# Patient Record
Sex: Male | Born: 2000 | Race: White | Hispanic: No | Marital: Single | Smoking: Never smoker
Health system: Southern US, Community
[De-identification: ages and names within clinical notes are randomized; demographics above are authoritative.]

## PROBLEM LIST (undated history)

## (undated) DIAGNOSIS — K219 Gastro-esophageal reflux disease without esophagitis: Secondary | ICD-10-CM

## (undated) DIAGNOSIS — G8929 Other chronic pain: Secondary | ICD-10-CM

## (undated) DIAGNOSIS — L309 Dermatitis, unspecified: Secondary | ICD-10-CM

## (undated) DIAGNOSIS — E119 Type 2 diabetes mellitus without complications: Secondary | ICD-10-CM

## (undated) DIAGNOSIS — T148XXA Other injury of unspecified body region, initial encounter: Secondary | ICD-10-CM

## (undated) DIAGNOSIS — I456 Pre-excitation syndrome: Secondary | ICD-10-CM

## (undated) HISTORY — DX: Gastro-esophageal reflux disease without esophagitis: K21.9

## (undated) HISTORY — DX: Other chronic pain: G89.29

## (undated) HISTORY — PX: TONSILLECTOMY: SUR1361

## (undated) HISTORY — DX: Type 2 diabetes mellitus without complications: E11.9

---
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
## 2018-07-09 ENCOUNTER — Emergency Department
Admission: EM | Admit: 2018-07-09 | Discharge: 2018-07-09 | Disposition: A | Discharge status: Home / Self Care | Payer: Medicaid Other | Attending: Emergency Medicine | Admitting: Emergency Medicine

## 2018-07-09 DIAGNOSIS — Z7722 Contact with and (suspected) exposure to environmental tobacco smoke (acute) (chronic): Secondary | ICD-10-CM | POA: Insufficient documentation

## 2018-07-09 DIAGNOSIS — R197 Diarrhea, unspecified: Secondary | ICD-10-CM | POA: Insufficient documentation

## 2018-07-09 DIAGNOSIS — K529 Noninfective gastroenteritis and colitis, unspecified: Secondary | ICD-10-CM

## 2018-07-09 LAB — URINALYSIS, COMPLETE (UACMP) WITH MICROSCOPIC
Bacteria, UA: NONE SEEN
Bilirubin Urine: NEGATIVE
Glucose, UA: NEGATIVE mg/dL
Hgb urine dipstick: NEGATIVE
Ketones, ur: NEGATIVE mg/dL
Leukocytes, UA: NEGATIVE
Nitrite: NEGATIVE
Protein, ur: NEGATIVE mg/dL
Specific Gravity, Urine: 1.024 (ref 1.005–1.030)
Squamous Epithelial / HPF: NONE SEEN (ref 0–5)
pH: 5 (ref 5.0–8.0)

## 2018-07-09 LAB — COMPREHENSIVE METABOLIC PANEL
ALK PHOS: 73 U/L (ref 38–126)
ALT: 142 U/L — ABNORMAL HIGH (ref 0–44)
AST: 104 U/L — ABNORMAL HIGH (ref 15–41)
Albumin: 4.6 g/dL (ref 3.5–5.0)
Anion gap: 9 (ref 5–15)
BUN: 14 mg/dL (ref 6–20)
CO2: 26 mmol/L (ref 22–32)
Calcium: 9.6 mg/dL (ref 8.9–10.3)
Chloride: 102 mmol/L (ref 98–111)
Creatinine, Ser: 0.72 mg/dL (ref 0.61–1.24)
GFR calc Af Amer: >60 mL/min (ref >60)
GFR calc non Af Amer: >60 mL/min (ref >60)
Glucose, Bld: 110 mg/dL — ABNORMAL HIGH (ref 70–99)
Potassium: 3.8 mmol/L (ref 3.5–5.1)
Sodium: 137 mmol/L (ref 135–145)
Total Bilirubin: 0.6 mg/dL (ref 0.3–1.2)
Total Protein: 7.6 g/dL (ref 6.5–8.1)

## 2018-07-09 LAB — CBC
HCT: 42.8 % (ref 39.0–52.0)
Hemoglobin: 14.8 g/dL (ref 13.0–17.0)
MCH: 28.6 pg (ref 26.0–34.0)
MCHC: 34.6 g/dL (ref 30.0–36.0)
MCV: 82.6 fL (ref 80.0–100.0)
Platelets: 294 10*3/uL (ref 150–400)
RBC: 5.18 MIL/uL (ref 4.22–5.81)
RDW: 12.4 % (ref 11.5–15.5)
WBC: 7 10*3/uL (ref 4.0–10.5)
nRBC: 0 % (ref 0.0–0.2)

## 2018-07-09 LAB — LIPASE, BLOOD: Lipase: 42 U/L (ref 11–51)

## 2018-07-09 MED ORDER — DICYCLOMINE HCL 10 MG/ML IM SOLN
20.0000 mg | Freq: Once | INTRAMUSCULAR | Status: AC
  Administered 2018-07-09: 20 mg via INTRAMUSCULAR
  Filled 2018-07-09: qty 2

## 2018-07-09 MED ORDER — SODIUM CHLORIDE 0.9% FLUSH: 3.0000 mL | Freq: Once | INTRAVENOUS | Status: DC

## 2018-07-09 NOTE — ED Triage Notes (Signed)
Patient c/o lower abdominal pain, N/V beginning Tuesday.

## 2018-07-09 NOTE — ED Provider Notes (Signed)
[facility] Emergency Department Provider Note  ____________________________________________   I have reviewed the triage vital signs and the nursing notes. Where available I have reviewed prior notes and, if possible and indicated, outside hospital notes.    HISTORY  Chief Complaint Abdominal Pain    HPI Aaron Parker is a 18 y.o. male  He reports a history of WPW diagnosed by EKG by [employer] but never reports having any kind of tachydysrhythmia that he knows of, otherwise healthy person states that he has several sick contacts in himself since Tuesday, has been having GI symptoms.  Started with vomiting on Tuesday, and then Wednesday he had multiple episodes of diarrhea, he states that he is actually doing better, the vomiting is resolved, he had a full meal this evening, and he still has diarrhea but not as much.  He is had 3 episodes today.  No bleeding.  No hematemesis.  No melena.  He denies any fever or chills.  He took some home Zofran which seemed to help.  The patient states that the reason he is here is that he had time to come in tonight and he was worried that he still having some cramping.  He has a diffuse lower abdominal discomfort which is a cramping pain that comes and goes and seems to be relieved mostly when he has a bowel movement.  Happens right before the bowel movement then he has a bowel movement that he is pain-free.  At this moment he is not complaining of pain.  He denies any prior surgery in his abdomen.  He has no recent travel or recent antibiotics.  He does work in [employer] and would like a work note He has no right lower quadrant pain no right upper quadrant pain, he has a crampy discomfort that is more in the center and left.  No testicular pain or swelling.  Past Medical History:  Diagnosis Date  . Eczema   . Muscle strain   . Wolff-Parkinson-White (WPW) syndrome     There are no active problems to display for this  patient.   Past Surgical History:  Procedure Laterality Date  . ADENOIDECTOMY  2007  . TONSILLECTOMY      Prior to Admission medications   Medication Sig Start Date End Date Taking? Authorizing Provider  amoxicillin-clavulanate (AUGMENTIN) 875-125 MG tablet Take 1 tablet by mouth 2 (two) times daily. 08/25/17   Fisher, Roselyn Bering, PA-C  dicyclomine (BENTYL) 10 MG capsule Take 1 capsule (10 mg total) by mouth 3 (three) times daily as needed for up to 14 days for spasms. 08/26/17 09/09/17  Willy Eddy, MD  ondansetron (ZOFRAN ODT) 4 MG disintegrating tablet Take 1 tablet (4 mg total) by mouth every 8 (eight) hours as needed for nausea or vomiting. 08/02/17   Merrily Brittle, MD  promethazine (PHENERGAN) 12.5 MG tablet Take 1 tablet (12.5 mg total) by mouth every 6 (six) hours as needed for nausea or vomiting. 08/26/17   Willy Eddy, MD    Allergies Codeine  No family history on file.  Social History Social History   Tobacco Use  . Smoking status: Passive Smoke Exposure - Never Smoker  . Smokeless tobacco: Never Used  Substance Use Topics  . Alcohol use: No  . Drug use: Not on file    Review of Systems Constitutional: No fever/chills Eyes: No visual changes. ENT: No sore throat. No stiff neck no neck pain Cardiovascular: Denies chest pain. Respiratory: Denies shortness of  breath. Gastrointestinal: See HPI  genitourinary: Negative for dysuria. Musculoskeletal: Negative lower extremity swelling Skin: Negative for rash. Neurological: Negative for severe headaches, focal weakness or numbness.   ____________________________________________   PHYSICAL EXAM:  VITAL SIGNS: ED Triage Vitals  Enc Vitals Group     BP 07/09/18 2008 138/75     Pulse Rate 07/09/18 2008 79     Resp 07/09/18 2008 18     Temp 07/09/18 2008 98.7 F (37.1 C)     Temp Source 07/09/18 2008 Oral     SpO2 07/09/18 2008 98 %     Weight 07/09/18 2008 230 lb (104.3 kg)     Height 07/09/18 2008 5'  10" (1.778 m)     Head Circumference --      Peak Flow --      Pain Score 07/09/18 2014 5     Pain Loc --      Pain Edu? --      Excl. in GC? --     Constitutional: Alert and oriented. Well appearing and in no acute distress. Eyes: Conjunctivae are normal Head: Atraumatic HEENT: No congestion/rhinnorhea. Mucous membranes are moist.  Oropharynx non-erythematous Neck:   Nontender with no meningismus, no masses, no stridor Cardiovascular: Normal rate, regular rhythm. Grossly normal heart sounds.  Good peripheral circulation. Respiratory: Normal respiratory effort.  No retractions. Lungs CTAB. Abdominal: Soft and nontender. No distention. No guarding no rebound Back:  There is no focal tenderness or step off.  there is no midline tenderness there are no lesions noted. there is no CVA tenderness Normal external male genitalia, circumcised, no lesions or testicular pain or swelling Musculoskeletal: No lower extremity tenderness, no upper extremity tenderness. No joint effusions, no DVT signs strong distal pulses no edema Neurologic:  Normal speech and language. No gross focal neurologic deficits are appreciated.  Skin:  Skin is warm, dry and intact. No rash noted. Psychiatric: Mood and affect are normal. Speech and behavior are normal.  ____________________________________________   LABS (all labs ordered are listed, but only abnormal results are displayed)  Labs Reviewed  COMPREHENSIVE METABOLIC PANEL - Abnormal; Notable for the following components:      Result Value   Glucose, Bld 110 (*)    AST 104 (*)    ALT 142 (*)    All other components within normal limits  URINALYSIS, COMPLETE (UACMP) WITH MICROSCOPIC - Abnormal; Notable for the following components:   Color, Urine YELLOW (*)    APPearance CLEAR (*)    All other components within normal limits  LIPASE, BLOOD  CBC    Pertinent labs  results that were available during my care of the patient were reviewed by me and  considered in my medical decision making (see chart for details). ____________________________________________  EKG  I personally interpreted any EKGs ordered by me or triage  ____________________________________________  RADIOLOGY  Pertinent labs & imaging results that were available during my care of the patient were reviewed by me and considered in my medical decision making (see chart for details). If possible, patient and/or family made aware of any abnormal findings.  No results found. ____________________________________________    PROCEDURES  Procedure(s) performed: None  Procedures  Critical Care performed: None  ____________________________________________   INITIAL IMPRESSION / ASSESSMENT AND PLAN / ED COURSE  Pertinent labs & imaging results that were available during my care of the patient were reviewed by me and considered in my medical decision making (see chart for details).  Well-appearing patient here with  lower abdominal cramping which is associated with diarrhea and vomiting, he is seems to be getting over a viral illness.  Liver function tests are very slightly elevated which can be consistent either with fatty liver which is a possibility given the patient's obesity, or his likely GI symptoms which again I feel are viral.  He has no focal abdominal pain in fact I cannot really reproduce any pain at this time.  I do not think that he has gallbladder disease or appendicitis and I do not feel that he would benefit from a CT scan.  Patient has been drinking very well "all day long" and has no evidence of dehydration and declines IV fluid.  We talked about CT scan but he and I both agree and shared decision making that it is not in his best interest.  I do not think he has an appendectomy or anything this can require surgery.  In addition, patient has no evidence of testicular mass or torsion.  Finally, patient is requesting something in case the cramping comes back.   I explained in the best thing to do for that would be a take fluids but he would like a medication for it, I have therefore agreed to give him a single dose of Bentyl here.  We will have him follow closely with this department if he feels worse and is very comfortable with this plan.    ____________________________________________   FINAL CLINICAL IMPRESSION(S) / ED DIAGNOSES  Final diagnoses:  None      This chart was dictated using voice recognition software.  Despite best efforts to proofread,  errors can occur which can change meaning.      Jeanmarie PlantMcShane, James A, MD 07/09/18 2158

## 2018-07-31 ENCOUNTER — Other Ambulatory Visit: Payer: Self-pay

## 2018-07-31 DIAGNOSIS — R109 Unspecified abdominal pain: Secondary | ICD-10-CM | POA: Insufficient documentation

## 2018-07-31 DIAGNOSIS — Z5321 Procedure and treatment not carried out due to patient leaving prior to being seen by health care provider: Secondary | ICD-10-CM | POA: Insufficient documentation

## 2018-07-31 LAB — COMPREHENSIVE METABOLIC PANEL
ALT: 117 U/L — ABNORMAL HIGH (ref 0–44)
AST: 77 U/L — ABNORMAL HIGH (ref 15–41)
Albumin: 4.7 g/dL (ref 3.5–5.0)
Alkaline Phosphatase: 73 U/L (ref 38–126)
Anion gap: 9 (ref 5–15)
BILIRUBIN TOTAL: 0.4 mg/dL (ref 0.3–1.2)
BUN: 16 mg/dL (ref 6–20)
CO2: 26 mmol/L (ref 22–32)
Calcium: 9.6 mg/dL (ref 8.9–10.3)
Chloride: 101 mmol/L (ref 98–111)
Creatinine, Ser: 0.65 mg/dL (ref 0.61–1.24)
GFR calc Af Amer: >60 mL/min (ref >60)
GFR calc non Af Amer: >60 mL/min (ref >60)
Glucose, Bld: 210 mg/dL — ABNORMAL HIGH (ref 70–99)
Potassium: 3.6 mmol/L (ref 3.5–5.1)
Sodium: 136 mmol/L (ref 135–145)
Total Protein: 8.1 g/dL (ref 6.5–8.1)

## 2018-07-31 LAB — URINALYSIS, COMPLETE (UACMP) WITH MICROSCOPIC
Bacteria, UA: NONE SEEN
Bilirubin Urine: NEGATIVE
Glucose, UA: >=500 mg/dL — AB
Hgb urine dipstick: NEGATIVE
Ketones, ur: 5 mg/dL — AB
Leukocytes,Ua: NEGATIVE
Nitrite: NEGATIVE
Protein, ur: NEGATIVE mg/dL
Specific Gravity, Urine: 1.022 (ref 1.005–1.030)
Squamous Epithelial / HPF: NONE SEEN (ref 0–5)
pH: 7 (ref 5.0–8.0)

## 2018-07-31 LAB — CBC
HCT: 42.8 % (ref 39.0–52.0)
Hemoglobin: 14.6 g/dL (ref 13.0–17.0)
MCH: 28.1 pg (ref 26.0–34.0)
MCHC: 34.1 g/dL (ref 30.0–36.0)
MCV: 82.5 fL (ref 80.0–100.0)
PLATELETS: 300 10*3/uL (ref 150–400)
RBC: 5.19 MIL/uL (ref 4.22–5.81)
RDW: 12.6 % (ref 11.5–15.5)
WBC: 7.9 10*3/uL (ref 4.0–10.5)
nRBC: 0 % (ref 0.0–0.2)

## 2018-07-31 LAB — LIPASE, BLOOD: Lipase: 44 U/L (ref 11–51)

## 2018-07-31 NOTE — ED Triage Notes (Signed)
Patient her for right lower quad pain with nausea and vomiting.  Reports was seen for same "couple" weeks ago and is no better.

## 2018-08-01 ENCOUNTER — Emergency Department
Admission: EM | Admit: 2018-08-01 | Discharge: 2018-08-01 | Disposition: A | Discharge status: Home / Self Care | Payer: Self-pay | Attending: Emergency Medicine | Admitting: Emergency Medicine

## 2018-08-01 LAB — HEMOGLOBIN A1C
Hgb A1c MFr Bld: 6.1 % — ABNORMAL HIGH (ref 4.8–5.6)
Mean Plasma Glucose: 128.37 mg/dL

## 2018-08-01 NOTE — ED Notes (Signed)
Called lab to make sure they have what they need to run new test ordered by MD; affirmative given

## 2018-08-01 NOTE — ED Notes (Signed)
Rounding in waiting room, pt is no longer present to be seen by provider

## 2018-08-02 ENCOUNTER — Telehealth: Payer: Self-pay | Admitting: Emergency Medicine

## 2018-08-02 NOTE — Telephone Encounter (Signed)
Called patient due to lwot to inquire about condition and follow up plans.  No answer and no voicemail  

## 2018-09-09 ENCOUNTER — Emergency Department: Payer: Self-pay

## 2018-09-09 ENCOUNTER — Emergency Department
Admission: EM | Admit: 2018-09-09 | Discharge: 2018-09-09 | Disposition: A | Discharge status: Home / Self Care | Payer: Self-pay | Attending: Emergency Medicine | Admitting: Emergency Medicine

## 2018-09-09 ENCOUNTER — Other Ambulatory Visit: Payer: Self-pay

## 2018-09-09 ENCOUNTER — Encounter: Payer: Self-pay | Admitting: Emergency Medicine

## 2018-09-09 DIAGNOSIS — Z7722 Contact with and (suspected) exposure to environmental tobacco smoke (acute) (chronic): Secondary | ICD-10-CM | POA: Insufficient documentation

## 2018-09-09 DIAGNOSIS — F329 Major depressive disorder, single episode, unspecified: Secondary | ICD-10-CM | POA: Insufficient documentation

## 2018-09-09 DIAGNOSIS — F32A Depression, unspecified: Secondary | ICD-10-CM

## 2018-09-09 LAB — COMPREHENSIVE METABOLIC PANEL
ALT: 129 U/L — ABNORMAL HIGH (ref 0–44)
AST: 54 U/L — ABNORMAL HIGH (ref 15–41)
Albumin: 4.9 g/dL (ref 3.5–5.0)
Alkaline Phosphatase: 76 U/L (ref 38–126)
Anion gap: 10 (ref 5–15)
BUN: 14 mg/dL (ref 6–20)
CO2: 26 mmol/L (ref 22–32)
Calcium: 9.9 mg/dL (ref 8.9–10.3)
Chloride: 102 mmol/L (ref 98–111)
Creatinine, Ser: 0.79 mg/dL (ref 0.61–1.24)
GFR calc Af Amer: >60 mL/min (ref >60)
GFR calc non Af Amer: >60 mL/min (ref >60)
Glucose, Bld: 105 mg/dL — ABNORMAL HIGH (ref 70–99)
Potassium: 3.5 mmol/L (ref 3.5–5.1)
Sodium: 138 mmol/L (ref 135–145)
Total Bilirubin: 0.8 mg/dL (ref 0.3–1.2)
Total Protein: 8.4 g/dL — ABNORMAL HIGH (ref 6.5–8.1)

## 2018-09-09 LAB — URINALYSIS, COMPLETE (UACMP) WITH MICROSCOPIC
Bacteria, UA: NONE SEEN
Bilirubin Urine: NEGATIVE
Glucose, UA: 50 mg/dL — AB
Hgb urine dipstick: NEGATIVE
Ketones, ur: NEGATIVE mg/dL
Leukocytes,Ua: NEGATIVE
Nitrite: NEGATIVE
Protein, ur: 30 mg/dL — AB
Specific Gravity, Urine: 1.033 — ABNORMAL HIGH (ref 1.005–1.030)
pH: 5 (ref 5.0–8.0)

## 2018-09-09 LAB — CBC
HCT: 45.2 % (ref 39.0–52.0)
Hemoglobin: 15.7 g/dL (ref 13.0–17.0)
MCH: 28.9 pg (ref 26.0–34.0)
MCHC: 34.7 g/dL (ref 30.0–36.0)
MCV: 83.1 fL (ref 80.0–100.0)
Platelets: 329 10*3/uL (ref 150–400)
RBC: 5.44 MIL/uL (ref 4.22–5.81)
RDW: 12.6 % (ref 11.5–15.5)
WBC: 8.1 10*3/uL (ref 4.0–10.5)
nRBC: 0 % (ref 0.0–0.2)

## 2018-09-09 LAB — TSH: TSH: 3.932 u[IU]/mL (ref 0.350–4.500)

## 2018-09-09 LAB — LIPASE, BLOOD: Lipase: 33 U/L (ref 11–51)

## 2018-09-09 MED ORDER — MIRTAZAPINE 7.5 MG PO TABS: 7.5000 mg | ORAL_TABLET | Freq: Every day | ORAL | 3 refills | Status: DC

## 2018-09-09 NOTE — ED Notes (Signed)
Pt also reports not being able to sleep for 8 days.

## 2018-09-09 NOTE — ED Triage Notes (Signed)
Patient presents to the ED with constipation and vomiting x 3 days.  Patient states he is only getting approx. 10-15 min of sleep at night.  Patient reports vomiting x 2 in the past 24 hours.  Patient states his last bowel movement was approx. 3-4 days ago.  Patient states, "my main concern is not sleeping, that sucks."  Patient states he does feel like his mind is racing.  Denies history of anxiety.  Patient states he has been staying with his mom in [place] but came to [place] today to help his ex-girlfriend with something.

## 2018-09-09 NOTE — Discharge Instructions (Addendum)
I talked to the psychiatrist.  She suggested taking mirtazapine 7.5 mg before bed.  She feels that this will help you sleep and also improve your appetite.  She wants you call and to go over to [facility] tomorrow.  They can continue with your treatment.  I would also advise getting out in the warm sun and taking a walk getting a little more exercise like you were used to.  Nothing really strenuous though.  Return here for any problems at all.  Be sure to follow-up with your primary care doctor to check on the hepatitis tests.  They should be back in the next 3 or 4 days.  You can follow-up with the [facility], the [place] [facility], the [facility] clinic, the [facility], [facility], [facility] charity care or the [facility] clinic.  These are sometimes for you may have to try several times.  You also have to fill out paperwork before hand.  If you have problems just come back here to the emergency room.

## 2018-09-09 NOTE — ED Provider Notes (Signed)
[facility] Emergency Department Provider Note   ____________________________________________   First MD Initiated Contact with Patient 09/09/18 1120     (approximate)  I have reviewed the triage vital signs and the nursing notes.   HISTORY  Chief Complaint Emesis; Constipation; and Insomnia    HPI Aaron Parker is a 18 y.o. male patient reports he slept very little in the last week or so.  He just has a lot of racing thoughts that he cannot stop when he tries to go to bed.  He vomited twice in the last 24 hours and has not stooled for 3 or 4 days.  Other than that he feels fine.        Past Medical History:  Diagnosis Date   Eczema    Muscle strain    Wolff-Parkinson-White (WPW) syndrome     There are no active problems to display for this patient.   Past Surgical History:  Procedure Laterality Date   ADENOIDECTOMY  2007   TONSILLECTOMY      Prior to Admission medications   Medication Sig Start Date End Date Taking? Authorizing Provider  amoxicillin-clavulanate (AUGMENTIN) 875-125 MG tablet Take 1 tablet by mouth 2 (two) times daily. 08/25/17   Fisher, Roselyn Bering, PA-C  dicyclomine (BENTYL) 10 MG capsule Take 1 capsule (10 mg total) by mouth 3 (three) times daily as needed for up to 14 days for spasms. 08/26/17 09/09/17  Willy Eddy, MD  mirtazapine (REMERON) 7.5 MG tablet Take 1 tablet (7.5 mg total) by mouth at bedtime. 09/09/18   Arnaldo Natal, MD  ondansetron (ZOFRAN ODT) 4 MG disintegrating tablet Take 1 tablet (4 mg total) by mouth every 8 (eight) hours as needed for nausea or vomiting. 08/02/17   Merrily Brittle, MD  promethazine (PHENERGAN) 12.5 MG tablet Take 1 tablet (12.5 mg total) by mouth every 6 (six) hours as needed for nausea or vomiting. 08/26/17   Willy Eddy, MD    Allergies Codeine  History reviewed. No pertinent family history.  Social History Social History   Tobacco Use   Smoking status: Passive  Smoke Exposure - Never Smoker   Smokeless tobacco: Never Used  Substance Use Topics   Alcohol use: No   Drug use: Not on file    Review of Systems  Constitutional: No fever/chills Eyes: No visual changes. ENT: No sore throat. Cardiovascular: Denies chest pain. Respiratory: Denies shortness of breath. Gastrointestinal: No abdominal pain.  No nausea, no vomiting except as noted in HPI.  No diarrhea.   constipation. Genitourinary: Negative for dysuria. Musculoskeletal: Negative for back pain. Skin: Negative for rash. Neurological: Negative for headaches, focal weakness  ____________________________________________   PHYSICAL EXAM:  VITAL SIGNS: ED Triage Vitals  Enc Vitals Group     BP 09/09/18 1107 139/84     Pulse Rate 09/09/18 1107 78     Resp 09/09/18 1107 20     Temp 09/09/18 1107 97.8 F (36.6 C)     Temp Source 09/09/18 1107 Oral     SpO2 09/09/18 1107 98 %     Weight 09/09/18 1111 225 lb (102.1 kg)     Height 09/09/18 1111 5\' 10"  (1.778 m)     Head Circumference --      Peak Flow --      Pain Score 09/09/18 1111 5     Pain Loc --      Pain Edu? --      Excl. in GC? --  Constitutional: Alert and oriented. Well appearing and in no acute distress. Eyes: Conjunctivae are normal.  Head: Atraumatic. Nose: No congestion/rhinnorhea. Mouth/Throat: Mucous membranes are moist.  Oropharynx non-erythematous. Neck: No stridor.  Cardiovascular: Normal rate, regular rhythm. Grossly normal heart sounds.  Good peripheral circulation. Respiratory: Normal respiratory effort.  No retractions. Lungs CTAB. Gastrointestinal: Soft and nontender. No distention. No abdominal bruits. No CVA tenderness. Musculoskeletal: No lower extremity tenderness nor edema.   Neurologic:  Normal speech and language. No gross focal neurologic deficits are appreciated.  Skin:  Skin is warm, dry and intact. No rash noted. Psychiatric: Mood and affect are normal. Speech and behavior are normal.   Patient does in no way appear to be manic  ____________________________________________   LABS (all labs ordered are listed, but only abnormal results are displayed)  Labs Reviewed  COMPREHENSIVE METABOLIC PANEL - Abnormal; Notable for the following components:      Result Value   Glucose, Bld 105 (*)    Total Protein 8.4 (*)    AST 54 (*)    ALT 129 (*)    All other components within normal limits  URINALYSIS, COMPLETE (UACMP) WITH MICROSCOPIC - Abnormal; Notable for the following components:   Color, Urine YELLOW (*)    APPearance HAZY (*)    Specific Gravity, Urine 1.033 (*)    Glucose, UA 50 (*)    Protein, ur 30 (*)    All other components within normal limits  LIPASE, BLOOD  CBC  TSH  HEPATITIS PANEL, ACUTE   ____________________________________________  EKG   ____________________________________________  RADIOLOGY  ED MD interpretation: Abdominal films look essentially normal.  Does not eat a lot of stool in the colon.  Official radiology report(s): Dg Abdomen Acute W/chest  Result Date: 09/09/2018 CLINICAL DATA:  18 y/o male patient presents to the ED with constipation and vomiting x 3 days. Patient states he is only getting approx. 10-15 min of sleep at night. Patient reports vomiting x 2 in the past 24 hours. Patient states his last bowel movement was approx. 3-4 days ago. Hx of WPW. Passive smoke exposure. EXAM: DG ABDOMEN ACUTE W/ 1V CHEST COMPARISON:  None. FINDINGS: There is no evidence of dilated bowel loops or free intraperitoneal air. No radiopaque calculi or other significant radiographic abnormality is seen. Heart size and mediastinal contours are within normal limits. Both lungs are clear. IMPRESSION: Negative abdominal radiographs.  No acute cardiopulmonary disease. Electronically Signed   By: Amie Portland M.D.   On: 09/09/2018 11:45    ____________________________________________   PROCEDURES  Procedure(s) performed (including Critical  Care):  Procedures   ____________________________________________   INITIAL IMPRESSION / ASSESSMENT AND PLAN / ED COURSE  I spoke with the patient in more detail.  He reports he is laying in bed thinking about things all night long nothing in particular just he can get to sleep.  He is not eating as much as usual for the last few weeks.  He is getting thinner and losing weight.  His pants are getting loose on him.  He shows me the pants he is wearing now he says they were tight and now he is got about 2 inches of his room despair.  He says he used to exercise a lot but when he got the WPW he developed symptoms and so he stopped exercising.  He says he is a little bit depressed.  He is not suicidal or homicidal.  I discussed him with Dr. Rebecca Eaton psychiatry.  She recommend mirtazapine  7.5 nightly.  She feels that this will help him sleep and increase his appetite.  His acute abdominal series does not show a lot of stool present.  Is probably because he is not eating much.  I recommended he also get outside in the sun and walk around a little bit.  He does not have to do anything really strenuous it would trigger his WPW.  He will come back here if he is any worse.  He will try to follow-up with primary care to to annual evaluation of his elevated LFTs.  Hepatitis panel still pending.  Not expecting it back for couple days yet.  She can follow-up with [facility].  He has the contact information I discussed it with him.              ____________________________________________   FINAL CLINICAL IMPRESSION(S) / ED DIAGNOSES  Final diagnoses:  Depression, unspecified depression type     ED Discharge Orders         Ordered    mirtazapine (REMERON) 7.5 MG tablet  Daily at bedtime     09/09/18 1355           Note:  This document was prepared using Dragon voice recognition software and may include unintentional dictation errors.    Arnaldo Natal, MD 09/09/18 1414

## 2018-09-10 LAB — HEPATITIS PANEL, ACUTE
HCV Ab: <0.1 s/co ratio (ref 0.0–0.9)
Hep A IgM: NEGATIVE
Hep B C IgM: NEGATIVE
Hepatitis B Surface Ag: NEGATIVE

## 2018-09-21 ENCOUNTER — Other Ambulatory Visit: Payer: Self-pay

## 2018-09-21 DIAGNOSIS — Z7722 Contact with and (suspected) exposure to environmental tobacco smoke (acute) (chronic): Secondary | ICD-10-CM | POA: Insufficient documentation

## 2018-09-21 DIAGNOSIS — G4700 Insomnia, unspecified: Secondary | ICD-10-CM | POA: Insufficient documentation

## 2018-09-21 DIAGNOSIS — F329 Major depressive disorder, single episode, unspecified: Secondary | ICD-10-CM | POA: Insufficient documentation

## 2018-09-21 DIAGNOSIS — Z79899 Other long term (current) drug therapy: Secondary | ICD-10-CM | POA: Insufficient documentation

## 2018-09-21 DIAGNOSIS — R945 Abnormal results of liver function studies: Secondary | ICD-10-CM | POA: Insufficient documentation

## 2018-09-21 DIAGNOSIS — I456 Pre-excitation syndrome: Secondary | ICD-10-CM | POA: Insufficient documentation

## 2018-09-21 LAB — URINALYSIS, COMPLETE (UACMP) WITH MICROSCOPIC
Bacteria, UA: NONE SEEN
Bilirubin Urine: NEGATIVE
Glucose, UA: NEGATIVE mg/dL
Hgb urine dipstick: NEGATIVE
Ketones, ur: 5 mg/dL — AB
Leukocytes,Ua: NEGATIVE
Nitrite: NEGATIVE
Protein, ur: 30 mg/dL — AB
Specific Gravity, Urine: 1.027 (ref 1.005–1.030)
pH: 6 (ref 5.0–8.0)

## 2018-09-21 LAB — CBC
HCT: 47.2 % (ref 39.0–52.0)
Hemoglobin: 16 g/dL (ref 13.0–17.0)
MCH: 28.5 pg (ref 26.0–34.0)
MCHC: 33.9 g/dL (ref 30.0–36.0)
MCV: 84.1 fL (ref 80.0–100.0)
Platelets: 327 10*3/uL (ref 150–400)
RBC: 5.61 MIL/uL (ref 4.22–5.81)
RDW: 12.5 % (ref 11.5–15.5)
WBC: 6.9 10*3/uL (ref 4.0–10.5)
nRBC: 0 % (ref 0.0–0.2)

## 2018-09-21 LAB — COMPREHENSIVE METABOLIC PANEL
ALT: 145 U/L — ABNORMAL HIGH (ref 0–44)
AST: 115 U/L — ABNORMAL HIGH (ref 15–41)
Albumin: 5.2 g/dL — ABNORMAL HIGH (ref 3.5–5.0)
Alkaline Phosphatase: 76 U/L (ref 38–126)
Anion gap: 14 (ref 5–15)
BUN: 13 mg/dL (ref 6–20)
CO2: 26 mmol/L (ref 22–32)
Calcium: 9.8 mg/dL (ref 8.9–10.3)
Chloride: 100 mmol/L (ref 98–111)
Creatinine, Ser: 0.67 mg/dL (ref 0.61–1.24)
GFR calc Af Amer: >60 mL/min (ref >60)
GFR calc non Af Amer: >60 mL/min (ref >60)
Glucose, Bld: 117 mg/dL — ABNORMAL HIGH (ref 70–99)
Potassium: 3.6 mmol/L (ref 3.5–5.1)
Sodium: 140 mmol/L (ref 135–145)
Total Bilirubin: 1.7 mg/dL — ABNORMAL HIGH (ref 0.3–1.2)
Total Protein: 8.7 g/dL — ABNORMAL HIGH (ref 6.5–8.1)

## 2018-09-21 LAB — LIPASE, BLOOD: Lipase: 33 U/L (ref 11–51)

## 2018-09-21 NOTE — ED Triage Notes (Signed)
Pt in with co insomnia, decreased appetite, and mid lower abd cramping. Was seen here for the same 2 weeks ago and given remeron. Pt states here for persistent symptoms, and increased abd cramping. Pt denies any SI or HI, when asked if he feels depressed he states "I guess".

## 2018-09-22 ENCOUNTER — Emergency Department: Payer: Self-pay

## 2018-09-22 ENCOUNTER — Emergency Department
Admission: EM | Admit: 2018-09-22 | Discharge: 2018-09-23 | Disposition: A | Discharge status: Other Acute Inpatient Hospital | Payer: Self-pay | Attending: Emergency Medicine | Admitting: Emergency Medicine

## 2018-09-22 DIAGNOSIS — F333 Major depressive disorder, recurrent, severe with psychotic symptoms: Secondary | ICD-10-CM

## 2018-09-22 DIAGNOSIS — G4700 Insomnia, unspecified: Secondary | ICD-10-CM | POA: Insufficient documentation

## 2018-09-22 DIAGNOSIS — R945 Abnormal results of liver function studies: Secondary | ICD-10-CM | POA: Insufficient documentation

## 2018-09-22 DIAGNOSIS — F329 Major depressive disorder, single episode, unspecified: Secondary | ICD-10-CM | POA: Diagnosis present

## 2018-09-22 DIAGNOSIS — R7989 Other specified abnormal findings of blood chemistry: Secondary | ICD-10-CM | POA: Insufficient documentation

## 2018-09-22 LAB — HEPATIC FUNCTION PANEL
ALT: 134 U/L — ABNORMAL HIGH (ref 0–44)
AST: 91 U/L — ABNORMAL HIGH (ref 15–41)
Albumin: 4.8 g/dL (ref 3.5–5.0)
Alkaline Phosphatase: 68 U/L (ref 38–126)
Bilirubin, Direct: 0.2 mg/dL (ref 0.0–0.2)
Indirect Bilirubin: 0.8 mg/dL (ref 0.3–0.9)
Total Bilirubin: 1 mg/dL (ref 0.3–1.2)
Total Protein: 8 g/dL (ref 6.5–8.1)

## 2018-09-22 LAB — ETHANOL: Alcohol, Ethyl (B): <10 mg/dL (ref <10)

## 2018-09-22 LAB — URINE DRUG SCREEN, QUALITATIVE (ARMC ONLY)
Amphetamines, Ur Screen: NOT DETECTED
Barbiturates, Ur Screen: NOT DETECTED
Benzodiazepine, Ur Scrn: NOT DETECTED
Cannabinoid 50 Ng, Ur ~~LOC~~: NOT DETECTED
Cocaine Metabolite,Ur ~~LOC~~: NOT DETECTED
MDMA (Ecstasy)Ur Screen: NOT DETECTED
Methadone Scn, Ur: NOT DETECTED
Opiate, Ur Screen: NOT DETECTED
Phencyclidine (PCP) Ur S: NOT DETECTED
Tricyclic, Ur Screen: NOT DETECTED

## 2018-09-22 MED ORDER — QUETIAPINE FUMARATE 25 MG PO TABS
25.0000 mg | ORAL_TABLET | Freq: Every day | ORAL | Status: DC
  Administered 2018-09-22: 21:00:00 25 mg via ORAL
  Filled 2018-09-22: qty 1

## 2018-09-22 MED ORDER — SERTRALINE HCL 50 MG PO TABS
25.0000 mg | ORAL_TABLET | Freq: Every day | ORAL | Status: DC
  Administered 2018-09-22 – 2018-09-23 (×2): 25 mg via ORAL
  Filled 2018-09-22 (×2): qty 1

## 2018-09-22 NOTE — BH Assessment (Signed)
Pt has been faxed out to several different inpatient treatment facilities for review.

## 2018-09-22 NOTE — ED Provider Notes (Signed)
[facility] Emergency Department Provider Note    First MD Initiated Contact with Patient 09/22/18 [phone]     (approximate)  I have reviewed the triage vital signs and the nursing notes.   HISTORY  Chief Complaint Insomnia    HPI Aaron Parker is a 18 y.o. male presents the ER for 3 weeks of nausea and decreased appetite.  States he is also been having hallucinations by hearing voices but not having command hallucinations.  States he is also not sleeping.  States he does feel stressed out.  Was recently started on medication to help with sleep but is not helping.  Denies any SI or HI.  States he would like to speak with psychiatrist.  Denies any abdominal pain at this time.    Past Medical History:  Diagnosis Date  . Eczema   . Muscle strain   . Wolff-Parkinson-White (WPW) syndrome    No family history on file. Past Surgical History:  Procedure Laterality Date  . ADENOIDECTOMY  2007  . TONSILLECTOMY     There are no active problems to display for this patient.     Prior to Admission medications   Medication Sig Start Date End Date Taking? Authorizing Provider  amoxicillin-clavulanate (AUGMENTIN) 875-125 MG tablet Take 1 tablet by mouth 2 (two) times daily. 08/25/17   Fisher, Roselyn BeringSusan W, PA-C  dicyclomine (BENTYL) 10 MG capsule Take 1 capsule (10 mg total) by mouth 3 (three) times daily as needed for up to 14 days for spasms. 08/26/17 09/09/17  Willy Eddyobinson, Dereke Neumann, MD  mirtazapine (REMERON) 7.5 MG tablet Take 1 tablet (7.5 mg total) by mouth at bedtime. 09/09/18   Arnaldo NatalMalinda, Paul F, MD  ondansetron (ZOFRAN ODT) 4 MG disintegrating tablet Take 1 tablet (4 mg total) by mouth every 8 (eight) hours as needed for nausea or vomiting. 08/02/17   Merrily Brittleifenbark, Neil, MD  promethazine (PHENERGAN) 12.5 MG tablet Take 1 tablet (12.5 mg total) by mouth every 6 (six) hours as needed for nausea or vomiting. 08/26/17   Willy Eddyobinson, Gokul Waybright, MD    Allergies Codeine    Social  History Social History   Tobacco Use  . Smoking status: Passive Smoke Exposure - Never Smoker  . Smokeless tobacco: Never Used  Substance Use Topics  . Alcohol use: No  . Drug use: Not on file    Review of Systems Patient denies headaches, rhinorrhea, blurry vision, numbness, shortness of breath, chest pain, edema, cough, abdominal pain, nausea, vomiting, diarrhea, dysuria, fevers, rashes or hallucinations unless otherwise stated above in HPI. ____________________________________________   PHYSICAL EXAM:  VITAL SIGNS: Vitals:   09/21/18 2240  BP: (!) 138/98  Pulse: 100  Resp: 20  Temp: 98.9 F (37.2 C)  SpO2: 100%    Constitutional: Alert and oriented.  Eyes: Conjunctivae are normal.  Head: Atraumatic. Nose: No congestion/rhinnorhea. Mouth/Throat: Mucous membranes are moist.   Neck: No stridor. Painless ROM.  Cardiovascular: Normal rate, regular rhythm. Grossly normal heart sounds.  Good peripheral circulation. Respiratory: Normal respiratory effort.  No retractions. Lungs CTAB. Gastrointestinal: Soft and nontender. No distention. No abdominal bruits. No CVA tenderness. Genitourinary:  Musculoskeletal: No lower extremity tenderness nor edema.  No joint effusions. Neurologic:  Normal speech and language. No gross focal neurologic deficits are appreciated. No facial droop Skin:  Skin is warm, dry and intact. No rash noted. Psychiatric: Mood and affect are normal. Speech and behavior are normal.  ____________________________________________   LABS (all labs ordered are listed, but only abnormal results  are displayed)  Results for orders placed or performed during the hospital encounter of 09/22/18 (from the past 24 hour(s))  CBC     Status: None   Collection Time: 09/21/18 10:41 PM  Result Value Ref Range   WBC 6.9 4.0 - 10.5 K/uL   RBC 5.61 4.22 - 5.81 MIL/uL   Hemoglobin 16.0 13.0 - 17.0 g/dL   HCT 33.8 25.0 - 53.9 %   MCV 84.1 80.0 - 100.0 fL   MCH 28.5 26.0  - 34.0 pg   MCHC 33.9 30.0 - 36.0 g/dL   RDW 76.7 34.1 - 93.7 %   Platelets 327 150 - 400 K/uL   nRBC 0.0 0.0 - 0.2 %  Comprehensive metabolic panel     Status: Abnormal   Collection Time: 09/21/18 10:41 PM  Result Value Ref Range   Sodium 140 135 - 145 mmol/L   Potassium 3.6 3.5 - 5.1 mmol/L   Chloride 100 98 - 111 mmol/L   CO2 26 22 - 32 mmol/L   Glucose, Bld 117 (H) 70 - 99 mg/dL   BUN 13 6 - 20 mg/dL   Creatinine, Ser 9.02 0.61 - 1.24 mg/dL   Calcium 9.8 8.9 - 40.9 mg/dL   Total Protein 8.7 (H) 6.5 - 8.1 g/dL   Albumin 5.2 (H) 3.5 - 5.0 g/dL   AST 735 (H) 15 - 41 U/L   ALT 145 (H) 0 - 44 U/L   Alkaline Phosphatase 76 38 - 126 U/L   Total Bilirubin 1.7 (H) 0.3 - 1.2 mg/dL   GFR calc non Af Amer >60 >60 mL/min   GFR calc Af Amer >60 >60 mL/min   Anion gap 14 5 - 15  Lipase, blood     Status: None   Collection Time: 09/21/18 10:41 PM  Result Value Ref Range   Lipase 33 11 - 51 U/L  Urinalysis, Complete w Microscopic     Status: Abnormal   Collection Time: 09/21/18 10:41 PM  Result Value Ref Range   Color, Urine AMBER (A) YELLOW   APPearance HAZY (A) CLEAR   Specific Gravity, Urine 1.027 1.005 - 1.030   pH 6.0 5.0 - 8.0   Glucose, UA NEGATIVE NEGATIVE mg/dL   Hgb urine dipstick NEGATIVE NEGATIVE   Bilirubin Urine NEGATIVE NEGATIVE   Ketones, ur 5 (A) NEGATIVE mg/dL   Protein, ur 30 (A) NEGATIVE mg/dL   Nitrite NEGATIVE NEGATIVE   Leukocytes,Ua NEGATIVE NEGATIVE   WBC, UA 0-5 0 - 5 WBC/hpf   Bacteria, UA NONE SEEN NONE SEEN   Squamous Epithelial / LPF 0-5 0 - 5   Mucus PRESENT    ____________________________________________ ____________________________________________  RADIOLOGY  I personally reviewed all radiographic images ordered to evaluate for the above acute complaints and reviewed radiology reports and findings.  These findings were personally discussed with the patient.  Please see medical record for radiology report.   ____________________________________________   PROCEDURES  Procedure(s) performed:  Procedures    Critical Care performed: no ____________________________________________   INITIAL IMPRESSION / ASSESSMENT AND PLAN / ED COURSE  Pertinent labs & imaging results that were available during my care of the patient were reviewed by me and considered in my medical decision making (see chart for details).   DDX: gastritis, hepatitis, cholelithiasis, pancreatitis, depression anxiety, schizophrenia, insomnia  Aaron Parker is a 18 y.o. who presents to the ED with symptoms as described above.  Patient nontoxic-appearing.  Certainly concerning for underlying psychiatric illness.  Will consult psychiatry.  Does have mildly elevated LFTs without any liver imaging.  Will order ultrasound.  Clinical Course as of Sep 22 454  Wed Sep 22, 2018  0355 Ultrasound consistent with fatty liver disease.  No evidence of cholelithiasis or cholecystitis.  Will give referral to GI.  Patient awaiting psychiatry consultation.  Do not feel there is indication for IVC.  Patient here voluntary.   [PR]  [phone] Patient evaluated by inpatient psychiatry and agrees to voluntary admission.  We discussed the results of his ultrasound.  Have discussed with the patient and available family all diagnostics and treatments performed thus far and all questions were answered to the best of my ability. The patient demonstrates understanding and agreement with plan.    [PR]    Clinical Course User Index [PR] Willy Eddy, MD    The patient was evaluated in Emergency Department today for the symptoms described in the history of present illness. He/she was evaluated in the context of the global COVID-19 pandemic, which necessitated consideration that the patient might be at risk for infection with the SARS-CoV-2 virus that causes COVID-19. Institutional protocols and algorithms that pertain to the evaluation of patients at  risk for COVID-19 are in a state of rapid change based on information released by regulatory bodies including the CDC and federal and state organizations. These policies and algorithms were followed during the patient's care in the ED.  As part of my medical decision making, I reviewed the following data within the electronic MEDICAL RECORD NUMBER Nursing notes reviewed and incorporated, Labs reviewed, notes from prior ED visits and [place] Controlled Substance Database   ____________________________________________   FINAL CLINICAL IMPRESSION(S) / ED DIAGNOSES  Final diagnoses:  Abnormal LFTs (liver function tests)  Insomnia, unspecified type      NEW MEDICATIONS STARTED DURING THIS VISIT:  New Prescriptions   No medications on file     Note:  This document was prepared using Dragon voice recognition software and may include unintentional dictation errors.    Willy Eddy, MD 09/22/18 [phone]

## 2018-09-22 NOTE — ED Notes (Signed)
Report to include Situation, Background, Assessment, and Recommendations received from Amy RN. Patient alert and oriented, warm and dry, in no acute distress. Patient denies SI, HI, AVH and pain. Patient made aware of Q15 minute rounds and security cameras for their safety. Patient instructed to come to me with needs or concerns.  

## 2018-09-22 NOTE — ED Notes (Signed)
This RN provided pt with update. Pt denies pain at this time. Pt denies SI/HI at this time. This RN will continue to monitor pt. No new orders at this time.

## 2018-09-22 NOTE — ED Notes (Signed)
Patient assigned to appropriate care area. Patient oriented to unit/care area: Informed that, for their safety, care areas are designed for safety and monitored by security cameras at all times; and visiting hours explained to patient. Patient verbalizes understanding, and verbal contract for safety obtained. 

## 2018-09-22 NOTE — ED Notes (Signed)
Orange juice provided to pt. Pt is calm and cooperative. NAD noted at this time. This RN will continue to monitor pt. No new orders at this time.

## 2018-09-22 NOTE — ED Notes (Signed)
Hourly rounding reveals patient in room. No complaints, stable, in no acute distress. Q15 minute rounds and monitoring via Security Cameras to continue. 

## 2018-09-22 NOTE — ED Notes (Signed)
Psychiatric NP at the bedside.

## 2018-09-22 NOTE — ED Notes (Signed)
Boxed meal tray and drink given to pt. Okay per Dr. Roxan Hockey and Psychiatric NP.

## 2018-09-22 NOTE — BH Assessment (Signed)
Pt's acceptance to [facility] is pending a new set of labs (per Intake Staff).  Once the labs have been collected, fax new labs to [phone]

## 2018-09-22 NOTE — ED Notes (Signed)
Snack and beverage given. 

## 2018-09-22 NOTE — ED Notes (Signed)
EDT tech, Scott present in room 13 to dress pt into appropriate [facility] scrubs. Pt transported to [facility] room 5 by EDT Scott.
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
## 2018-09-22 NOTE — BH Assessment (Signed)
Patient's information has been faxed to the following facilities for inpatient treatment:   [facility]       [facility]     [facility]       [facility]     [facility]       [facility]     [facility]     [facility]     [facility]     [facility]     [facility]
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
## 2018-09-22 NOTE — ED Notes (Signed)
Pt dressed out. Pt belongings bag:  1 wallet 1 phone Set of keys Pair of airpods 1 grey sweat shirt 1 jean pants 1 black pair of shoes 1 pair black socks 1 black briefs

## 2018-09-22 NOTE — ED Notes (Signed)
Pt in US

## 2018-09-23 NOTE — ED Notes (Signed)
Hourly rounding reveals patient in room. No complaints, stable, in no acute distress. Q15 minute rounds and monitoring via Security Cameras to continue. 

## 2018-09-23 NOTE — ED Provider Notes (Signed)
-----------------------------------------   4:48 AM on 09/23/2018 -----------------------------------------   Blood pressure 140/73, pulse 79, temperature 97.7 F (36.5 C), temperature source Oral, resp. rate 18, height 5\' 10"  (1.778 m), weight 99.8 kg, SpO2 98 %.  The patient is calm and cooperative at this time.  There have been no acute events since the last update.  Awaiting disposition plan from Behavioral Medicine team. Patient is set up for transfer later today.   Arnaldo Natal, MD 09/23/18 [phone]

## 2018-09-23 NOTE — ED Provider Notes (Signed)
Patient has been accepted to [facility].  Patient assigned to room [facility] Accepting physician is Dr. Dollene Cleveland.  Call report to [phone].  Representative was Alane.   ER Staff is aware of it:  ER Secretary  Dr.  ER MD  [place] Patient's Nurse     Pt can arrive any time after 7AM  Officer is to [facility] pt to [facility] for screening prior to admission

## 2019-05-17 ENCOUNTER — Other Ambulatory Visit: Payer: Self-pay

## 2019-05-17 DIAGNOSIS — Z20822 Contact with and (suspected) exposure to covid-19: Secondary | ICD-10-CM

## 2019-05-19 LAB — NOVEL CORONAVIRUS, NAA: SARS-CoV-2, NAA: NOT DETECTED

## 2019-09-22 ENCOUNTER — Ambulatory Visit: Payer: Self-pay | Attending: Internal Medicine

## 2019-09-22 DIAGNOSIS — Z23 Encounter for immunization: Secondary | ICD-10-CM

## 2019-09-22 NOTE — Progress Notes (Signed)
   Covid-19 Vaccination Clinic  Name:  Nyxon Strupp III    MRN: [id number] DOB: Jan 11, 2001  09/22/2019  Mr. Rothermel was observed post Covid-19 immunization for 15 minutes without incident. He was provided with Vaccine Information Sheet and instruction to access the V-Safe system.   Mr. Swider was instructed to call 911 with any severe reactions post vaccine: Marland Kitchen Difficulty breathing  . Swelling of face and throat  . A fast heartbeat  . A bad rash all over body  . Dizziness and weakness   Immunizations Administered    Name Date Dose VIS Date Route   Pfizer COVID-19 Vaccine 09/22/2019 12:19 PM 0.3 mL 05/20/2019 Intramuscular   Manufacturer: [employer], [employer]   Lot: [id number]   NDC: [address]-2

## 2019-10-17 ENCOUNTER — Ambulatory Visit: Payer: Self-pay | Attending: Internal Medicine

## 2019-10-17 DIAGNOSIS — Z23 Encounter for immunization: Secondary | ICD-10-CM

## 2019-10-17 NOTE — Progress Notes (Signed)
   Covid-19 Vaccination Clinic  Name:  Aaron Parker    MRN: [id number] DOB: 04/30/2001  10/17/2019  [name] was observed post Covid-19 immunization for 15 minutes without incident. He was provided with Vaccine Information Sheet and instruction to access the V-Safe system.   [name] was instructed to call 911 with any severe reactions post vaccine: Marland Kitchen Difficulty breathing  . Swelling of face and throat  . A fast heartbeat  . A bad rash all over body  . Dizziness and weakness   Immunizations Administered    Name Date Dose VIS Date Route   Pfizer COVID-19 Vaccine 10/17/2019 11:59 AM 0.3 mL 08/03/2018 Intramuscular   Manufacturer: [employer], [employer]   Lot: [id number]   NDC: [address]-5

## 2019-12-16 ENCOUNTER — Other Ambulatory Visit: Payer: Self-pay

## 2019-12-16 DIAGNOSIS — R739 Hyperglycemia, unspecified: Secondary | ICD-10-CM | POA: Insufficient documentation

## 2019-12-16 DIAGNOSIS — Z7722 Contact with and (suspected) exposure to environmental tobacco smoke (acute) (chronic): Secondary | ICD-10-CM | POA: Insufficient documentation

## 2019-12-16 DIAGNOSIS — R7401 Elevation of levels of liver transaminase levels: Secondary | ICD-10-CM | POA: Insufficient documentation

## 2019-12-16 DIAGNOSIS — L732 Hidradenitis suppurativa: Secondary | ICD-10-CM | POA: Insufficient documentation

## 2019-12-16 DIAGNOSIS — L02412 Cutaneous abscess of left axilla: Secondary | ICD-10-CM | POA: Insufficient documentation

## 2019-12-16 DIAGNOSIS — Z7984 Long term (current) use of oral hypoglycemic drugs: Secondary | ICD-10-CM | POA: Insufficient documentation

## 2019-12-16 LAB — COMPREHENSIVE METABOLIC PANEL
ALT: 93 U/L — ABNORMAL HIGH (ref 0–44)
AST: 66 U/L — ABNORMAL HIGH (ref 15–41)
Albumin: 4.6 g/dL (ref 3.5–5.0)
Alkaline Phosphatase: 64 U/L (ref 38–126)
Anion gap: 13 (ref 5–15)
BUN: 13 mg/dL (ref 6–20)
CO2: 24 mmol/L (ref 22–32)
Calcium: 9.7 mg/dL (ref 8.9–10.3)
Chloride: 98 mmol/L (ref 98–111)
Creatinine, Ser: 0.79 mg/dL (ref 0.61–1.24)
GFR calc Af Amer: >60 mL/min (ref >60)
GFR calc non Af Amer: >60 mL/min (ref >60)
Glucose, Bld: 212 mg/dL — ABNORMAL HIGH (ref 70–99)
Potassium: 3.8 mmol/L (ref 3.5–5.1)
Sodium: 135 mmol/L (ref 135–145)
Total Bilirubin: 0.8 mg/dL (ref 0.3–1.2)
Total Protein: 8.3 g/dL — ABNORMAL HIGH (ref 6.5–8.1)

## 2019-12-16 LAB — CBC
HCT: 41.2 % (ref 39.0–52.0)
Hemoglobin: 14.9 g/dL (ref 13.0–17.0)
MCH: 28.1 pg (ref 26.0–34.0)
MCHC: 36.2 g/dL — ABNORMAL HIGH (ref 30.0–36.0)
MCV: 77.7 fL — ABNORMAL LOW (ref 80.0–100.0)
Platelets: 402 10*3/uL — ABNORMAL HIGH (ref 150–400)
RBC: 5.3 MIL/uL (ref 4.22–5.81)
RDW: 12.3 % (ref 11.5–15.5)
WBC: 9.6 10*3/uL (ref 4.0–10.5)
nRBC: 0 % (ref 0.0–0.2)

## 2019-12-16 LAB — LIPASE, BLOOD: Lipase: 43 U/L (ref 11–51)

## 2019-12-16 NOTE — ED Triage Notes (Signed)
Patient c/o left/upper abdominal pain for the last several days. Patient reports hard/painful area area in left axilla.

## 2019-12-17 ENCOUNTER — Emergency Department
Admission: EM | Admit: 2019-12-17 | Discharge: 2019-12-17 | Disposition: A | Discharge status: Home / Self Care | Payer: Self-pay | Attending: Emergency Medicine | Admitting: Emergency Medicine

## 2019-12-17 ENCOUNTER — Emergency Department: Payer: Self-pay

## 2019-12-17 DIAGNOSIS — R739 Hyperglycemia, unspecified: Secondary | ICD-10-CM

## 2019-12-17 DIAGNOSIS — R1012 Left upper quadrant pain: Secondary | ICD-10-CM

## 2019-12-17 DIAGNOSIS — L732 Hidradenitis suppurativa: Secondary | ICD-10-CM

## 2019-12-17 DIAGNOSIS — L0291 Cutaneous abscess, unspecified: Secondary | ICD-10-CM

## 2019-12-17 DIAGNOSIS — R7401 Elevation of levels of liver transaminase levels: Secondary | ICD-10-CM

## 2019-12-17 MED ORDER — SULFAMETHOXAZOLE-TRIMETHOPRIM 800-160 MG PO TABS
1.0000 | ORAL_TABLET | Freq: Once | ORAL | Status: AC
  Administered 2019-12-17: 1 via ORAL
  Filled 2019-12-17: qty 1

## 2019-12-17 MED ORDER — IOHEXOL 300 MG/ML  SOLN
100.0000 mL | Freq: Once | INTRAMUSCULAR | Status: AC | PRN
  Administered 2019-12-17: 100 mL via INTRAVENOUS

## 2019-12-17 MED ORDER — LIDOCAINE HCL (PF) 1 % IJ SOLN
5.0000 mL | Freq: Once | INTRAMUSCULAR | Status: AC
  Administered 2019-12-17: 5 mL
  Filled 2019-12-17: qty 5

## 2019-12-17 MED ORDER — LIDOCAINE-PRILOCAINE 2.5-2.5 % EX CREA
TOPICAL_CREAM | Freq: Once | CUTANEOUS | Status: AC
  Filled 2019-12-17: qty 5

## 2019-12-17 MED ORDER — SULFAMETHOXAZOLE-TRIMETHOPRIM 800-160 MG PO TABS
1.0000 | ORAL_TABLET | Freq: Two times a day (BID) | ORAL | 0 refills | Status: AC
Start: 2019-12-17 — End: 2019-12-24

## 2019-12-17 MED ORDER — METFORMIN HCL 500 MG PO TABS: 500.0000 mg | ORAL_TABLET | Freq: Two times a day (BID) | ORAL | 1 refills | Status: DC

## 2019-12-17 NOTE — Discharge Instructions (Signed)
Your CT scan does not reveal a cause for your abdominal pain.  Please take the antibiotics prescribed twice a day for the full 7 days for your abscess.  Keep the area nice and clean.  Is okay to wash with warm water and soap.  Remove a quarter of an inch of the packing every 48 hours and cut it.  Do not use deodorant.  Follow-up with the primary care doctor for further evaluation of your newly diagnosed diabetes.  Take Metformin twice a day with breakfast and dinner as prescribed.  You also need to talk to your primary care doctor about your elevated liver enzymes which you have had for a while.  Return to the emergency room for worsening pain or swelling of the abscess, fever, nausea or vomiting.

## 2019-12-17 NOTE — ED Provider Notes (Signed)
[facility] Emergency Department Provider Note  ____________________________________________  Time seen: Approximately 3:17 AM  I have reviewed the triage vital signs and the nursing notes.   HISTORY  Chief Complaint Abdominal Pain   HPI Aaron Parker is a 19 y.o. male history of eczema, WPW, abnormal LFTs who presents for evaluation of abdominal pain.  Patient reports 2 days of constant left upper quadrant abdominal pain. He describes the pain as "feels like there is a mass inside my stomach pushing it out." The pain is constant, not worse postprandially, not associated with nausea, vomiting, diarrhea, constipation, chest pain, shortness of breath, fever or chills. No prior abdominal surgeries. Patient also complaining of an abscess in his left axilla which has been there for 2 days. Has been getting progressively worse. Patient has had several in the past in bilateral axilla area. Patient does not take NSAIDs, no history of peptic ulcer disease. He reports that he drinks occasionally, once a month.  Past Medical History:  Diagnosis Date  . Eczema   . Muscle strain   . Wolff-Parkinson-White (WPW) syndrome     Patient Active Problem List   Diagnosis Date Noted  . MDD (major depressive disorder) 09/22/2018  . Abnormal LFTs (liver function tests)   . Insomnia     Past Surgical History:  Procedure Laterality Date  . ADENOIDECTOMY  2007  . TONSILLECTOMY      Prior to Admission medications   Medication Sig Start Date End Date Taking? Authorizing Provider  HYDROXYZINE HCL PO Take 25 mg by mouth at bedtime as needed.   Yes [provider]  ibuprofen (ADVIL) 200 MG tablet Take 400 mg by mouth every 6 (six) hours as needed for moderate pain.   Yes [provider]  metFORMIN (GLUCOPHAGE) 500 MG tablet Take 1 tablet (500 mg total) by mouth 2 (two) times daily with a meal. 12/17/19 12/16/20  Don Perking, [place], MD    sulfamethoxazole-trimethoprim (BACTRIM DS) 800-160 MG tablet Take 1 tablet by mouth 2 (two) times daily for 7 days. 12/17/19 12/24/19  Nita Sickle, MD    Allergies Codeine  No family history on file.  Social History Social History   Tobacco Use  . Smoking status: Passive Smoke Exposure - Never Smoker  . Smokeless tobacco: Never Used  Substance Use Topics  . Alcohol use: Yes  . Drug use: Not on file    Review of Systems  Constitutional: Negative for fever. Eyes: Negative for visual changes. ENT: Negative for sore throat. Neck: No neck pain  Cardiovascular: Negative for chest pain. Respiratory: Negative for shortness of breath. Gastrointestinal: + LUQ abdominal pain. No vomiting or diarrhea. Genitourinary: Negative for dysuria. Musculoskeletal: Negative for back pain. Skin: Negative for rash. + L axillary abscess Neurological: Negative for headaches, weakness or numbness. Psych: No SI or HI  ____________________________________________   PHYSICAL EXAM:  VITAL SIGNS: ED Triage Vitals  Enc Vitals Group     BP 12/16/19 2240 (!) 148/93     Pulse Rate 12/16/19 2240 100     Resp 12/16/19 2240 18     Temp 12/16/19 2240 98.3 F (36.8 C)     Temp Source 12/17/19 0226 Oral     SpO2 12/16/19 2240 97 %     Weight 12/16/19 2240 220 lb 0.3 oz (99.8 kg)     Height 12/16/19 2240 5\' 10"  (1.778 m)     Head Circumference --      Peak Flow --  Pain Score 12/16/19 2240 7     Pain Loc --      Pain Edu? --      Excl. in GC? --     Constitutional: Alert and oriented. Well appearing and in no apparent distress. HEENT:      Head: Normocephalic and atraumatic.         Eyes: Conjunctivae are normal. Sclera is non-icteric.       Mouth/Throat: Mucous membranes are moist.       Neck: Supple with no signs of meningismus. Cardiovascular: Regular rate and rhythm. No murmurs, gallops, or rubs.  Respiratory: Normal respiratory effort. Lungs are clear to auscultation  bilaterally.  Gastrointestinal: Soft, tender to palpation over the LUQ, and non distended with positive bowel sounds. No rebound or guarding. Genitourinary: No CVA tenderness. Musculoskeletal: No edema, cyanosis, or erythema of extremities. Neurologic: Normal speech and language. Face is symmetric. Moving all extremities. No gross focal neurologic deficits are appreciated. Skin: Skin is warm, dry and intact. Patient with several small abscesses measuring less than 0.5cm and scarring of b/l axillary region consistent with hidradenitis suppurativa. There is a large area of induration on the L axilla with minimal overlying erythema or warmth but tender to palpation measuring 3cm Psychiatric: Mood and affect are normal. Speech and behavior are normal.  ____________________________________________   LABS (all labs ordered are listed, but only abnormal results are displayed)  Labs Reviewed  COMPREHENSIVE METABOLIC PANEL - Abnormal; Notable for the following components:      Result Value   Glucose, Bld 212 (*)    Total Protein 8.3 (*)    AST 66 (*)    ALT 93 (*)    All other components within normal limits  CBC - Abnormal; Notable for the following components:   MCV 77.7 (*)    MCHC 36.2 (*)    Platelets 402 (*)    All other components within normal limits  LIPASE, BLOOD  URINALYSIS, COMPLETE (UACMP) WITH MICROSCOPIC   ____________________________________________  EKG  ED ECG REPORT I, Nita Sickle, the attending physician, personally viewed and interpreted this ECG.  Sinus rhythm with a delta wave consistent with WPW, normal intervals, no ST elevations. Unchanged from prior. ____________________________________________  RADIOLOGY  I have personally reviewed the images performed during this visit and I agree with the Radiologist's read.   Interpretation by Radiologist:  CT ABDOMEN PELVIS W CONTRAST  Result Date: 12/17/2019 CLINICAL DATA:  LEFT/upper abdominal pain for the  last several days. EXAM: CT ABDOMEN AND PELVIS WITH CONTRAST TECHNIQUE: Multidetector CT imaging of the abdomen and pelvis was performed using the standard protocol following bolus administration of intravenous contrast. CONTRAST:  OMNIPAQUE IOHEXOL 300 MG/ML  SOLN COMPARISON:  None. FINDINGS: Lower chest: No acute abnormality. Hepatobiliary: Liver is diffusely low in density suggesting fatty infiltration. No focal mass or lesion is seen within the liver. Gallbladder appears normal. No bile duct dilatation. Pancreas: Unremarkable. No pancreatic ductal dilatation or surrounding inflammatory changes. Spleen: Normal in size without focal abnormality. Adrenals/Urinary Tract: Adrenal glands appear normal. Kidneys are unremarkable without mass, stone or hydronephrosis. No perinephric fluid. No ureteral or bladder calculi identified. Bladder is unremarkable, decompressed. Stomach/Bowel: No dilated large or small bowel loops. No evidence of bowel wall inflammation. Appendix is normal. Stomach is unremarkable. Vascular/Lymphatic: No vascular abnormality seen. No enlarged lymph nodes seen. Reproductive: Prostate is unremarkable. Other: No free fluid or abscess collection. No free intraperitoneal air. Musculoskeletal: No acute or significant osseous findings. IMPRESSION: 1. No acute  findings within the abdomen or pelvis. No bowel obstruction or evidence of bowel wall inflammation. No evidence of acute solid organ abnormality. No renal or ureteral calculi. Appendix is normal. 2. Fatty infiltration of the liver. Electronically Signed   By: Bary Richard M.D.   On: 12/17/2019 05:19      ____________________________________________   PROCEDURES  Procedure(s) performed:yes .1-3 Lead EKG Interpretation Performed by: Nita Sickle, MD Authorized by: Nita Sickle, MD     Interpretation: non-specific     ECG rate assessment: normal     Rhythm: sinus rhythm     Ectopy: none    .Marland KitchenIncision and  Drainage  Date/Time: 12/17/2019 6:17 AM Performed by: Nita Sickle, MD Authorized by: Nita Sickle, MD   Consent:    Consent obtained:  Verbal   Consent given by:  Patient   Risks discussed:  Bleeding, infection, incomplete drainage and pain   Alternatives discussed:  Alternative treatment, delayed treatment and observation Location:    Type:  Abscess   Size:  3   Location:  Trunk   Trunk location: L axilla. Pre-procedure details:    Skin preparation:  Betadine Anesthesia (see MAR for exact dosages):    Anesthesia method:  Local infiltration and topical application   Topical anesthetic:  EMLA cream   Local anesthetic:  Lidocaine 1% w/o epi Procedure type:    Complexity:  Complex Procedure details:    Needle aspiration: no     Incision types:  Stab incision   Scalpel blade:  11   Wound management:  Probed and deloculated and irrigated with saline   Drainage:  Purulent   Drainage amount:  Moderate   Wound treatment:  Wound left open   Packing materials:  1/2 in iodoform gauze Post-procedure details:    Patient tolerance of procedure:  Tolerated well, no immediate complications   Critical Care performed:  None ____________________________________________   INITIAL IMPRESSION / ASSESSMENT AND PLAN / ED COURSE   19 y.o. male history of eczema, WPW, abnormal LFTs who presents for evaluation of abdominal pain and L axilla abscess. Patient is well-appearing with no distress, and normal vital signs, abdomen is soft with left upper quadrant tenderness with no rebound or guarding. Patient also has an abscess on the left axilla measuring 3 cm. Patient has findings consistent of hydradenitis suppurativa.  Labs showing glucose of 212. Patient denies any history of diabetes. At this time it warrants putting him on Metformin with an elevated blood glucose and for better control of his HS. Will recommend follow-up with PCP for further evaluation of diabetes. Abscess will be  drained. Patient be placed on Bactrim. Normal white count and no signs of sepsis. LFTs are elevated however that is chronic for patient. He denies history of drinking or cirrhosis of the liver. CT of the abdomen and pelvis are pending. Differential diagnoses including gastritis versus peptic ulcer disease versus pancreatitis versus diverticulitis versus SBO versus kidney stone. Old medical records reviewed.   _________________________ 6:18 AM on 12/17/2019 -----------------------------------------  CT of the abdomen and pelvis with no acute findings, confirmed by radiology.  Will discharge home on Bactrim for abscess with close follow-up with primary care doctor for treatment of his hydradenitis suppurativa and newly diagnosed diabetes.  Patient was provided with a prescription for Metformin.  Discussed standard return precautions with patient.    _____________________________________________ Please note:  Patient was evaluated in Emergency Department today for the symptoms described in the history of present illness. Patient was evaluated in the context  of the global COVID-19 pandemic, which necessitated consideration that the patient might be at risk for infection with the SARS-CoV-2 virus that causes COVID-19. Institutional protocols and algorithms that pertain to the evaluation of patients at risk for COVID-19 are in a state of rapid change based on information released by regulatory bodies including the CDC and federal and state organizations. These policies and algorithms were followed during the patient's care in the ED.  Some ED evaluations and interventions may be delayed as a result of limited staffing during the pandemic.   [place] Controlled Substance Database was reviewed by me. ____________________________________________   FINAL CLINICAL IMPRESSION(S) / ED DIAGNOSES   Final diagnoses:  Abscess  Hidradenitis suppurativa  Left upper quadrant abdominal pain  Hyperglycemia   Transaminitis      NEW MEDICATIONS STARTED DURING THIS VISIT:  ED Discharge Orders         Ordered    sulfamethoxazole-trimethoprim (BACTRIM DS) 800-160 MG tablet  2 times daily     Discontinue  Reprint     12/17/19 0614    metFORMIN (GLUCOPHAGE) 500 MG tablet  2 times daily with meals     Discontinue  Reprint     12/17/19 0615           Note:  This document was prepared using Dragon voice recognition software and may include unintentional dictation errors.    Don PerkingVeronese, [place], MD 12/17/19 [phone]

## 2021-05-05 IMAGING — CT CT ABD-PELV W/ CM
2 of 4 series · 16 of 46 positions shown, 18 images · IV contrast (APPLIED)
Comparison: None.

CLINICAL DATA: LEFT/upper abdominal pain for the last several days.

EXAM:
CT ABDOMEN AND PELVIS WITH CONTRAST
TECHNIQUE: Multidetector CT imaging of the abdomen and pelvis was performed
using the standard protocol following bolus administration of
intravenous contrast.
CONTRAST:  100mL OMNIPAQUE IOHEXOL 300 MG/ML  SOLN

[Series 2: routine abd/pel with · axial · 0.86mm/px · z∈[-1187,-722]mm · 13 of 103 slices shown, 15 images]
[im 5/103  soft-tissue]
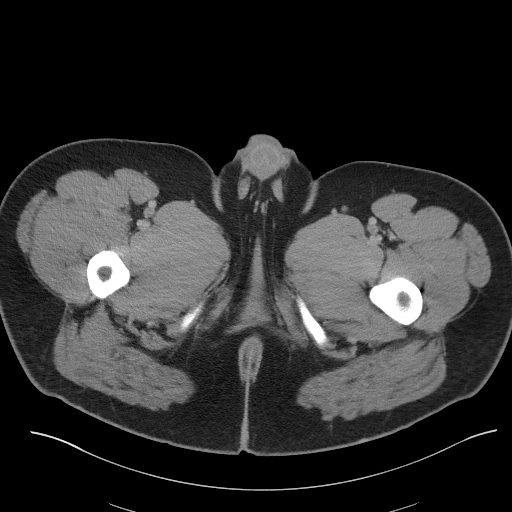
[im 5/103  bone]
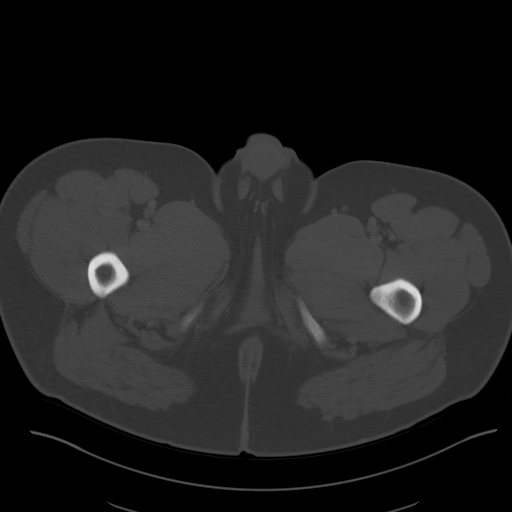
[im 13/103  soft-tissue]
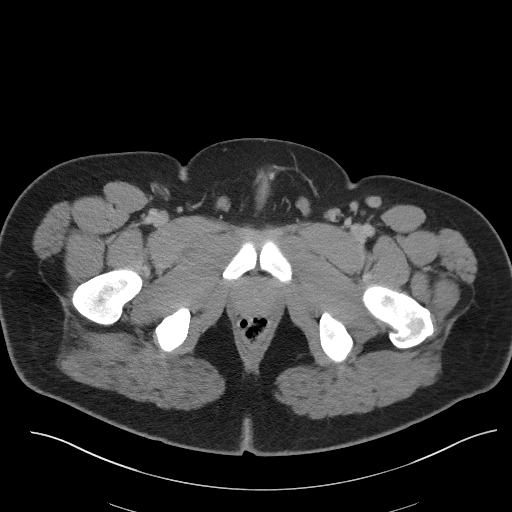
[im 22/103  soft-tissue]
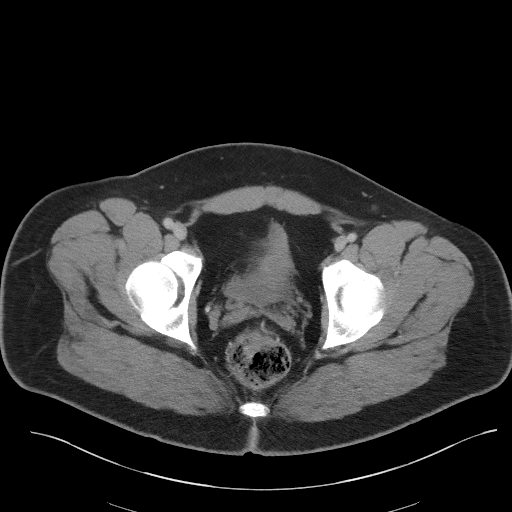
[im 30/103  soft-tissue]
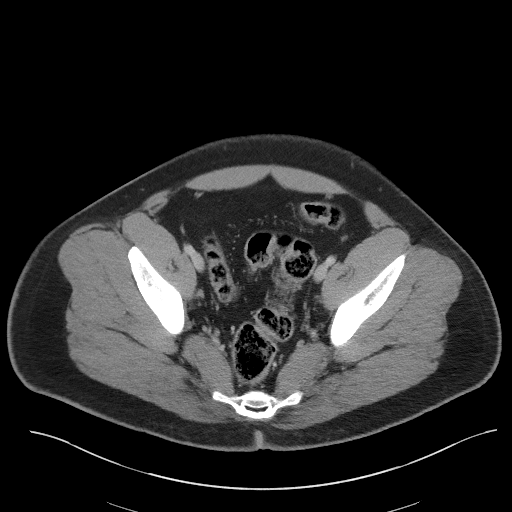
[im 35/103  soft-tissue]
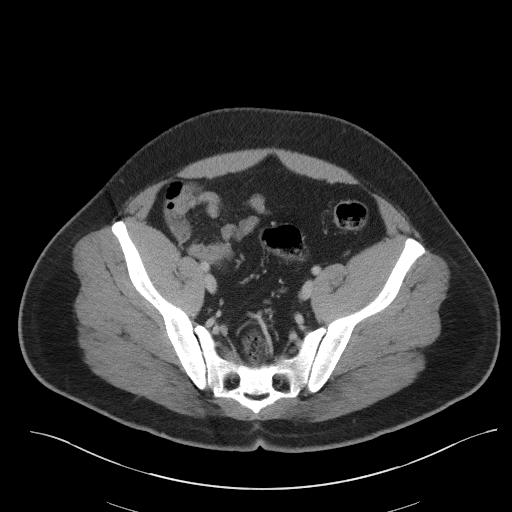
[im 43/103  soft-tissue]
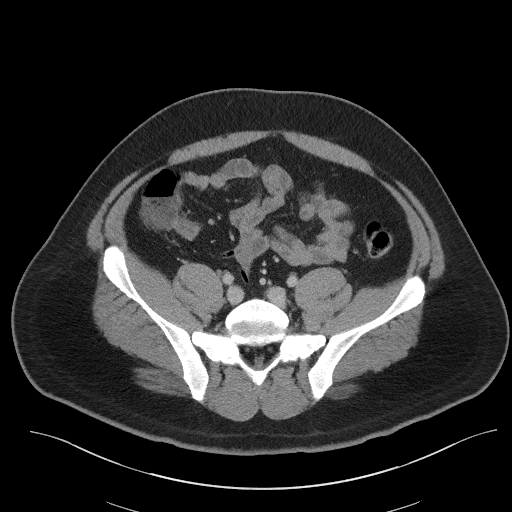
[im 52/103  soft-tissue]
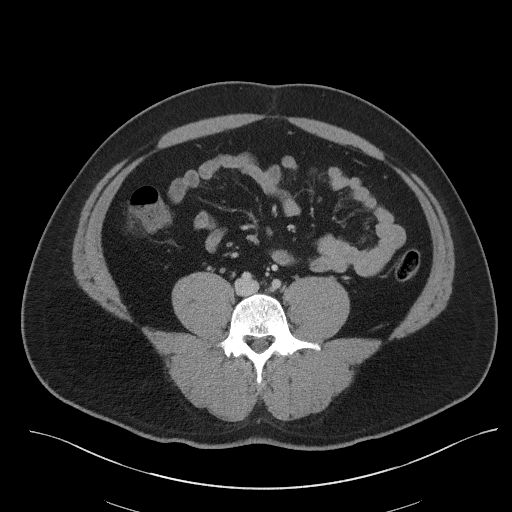
[im 60/103  soft-tissue]
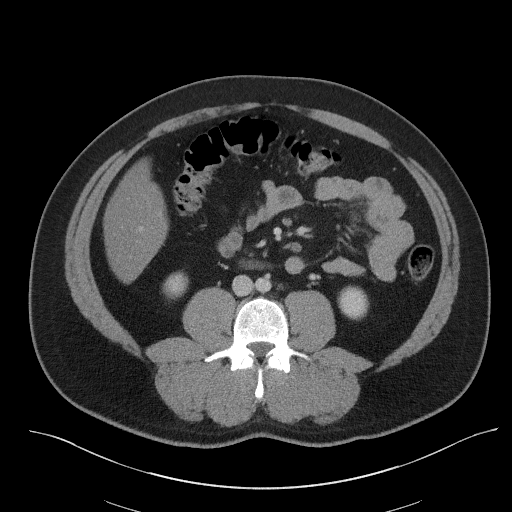
[im 69/103  soft-tissue]
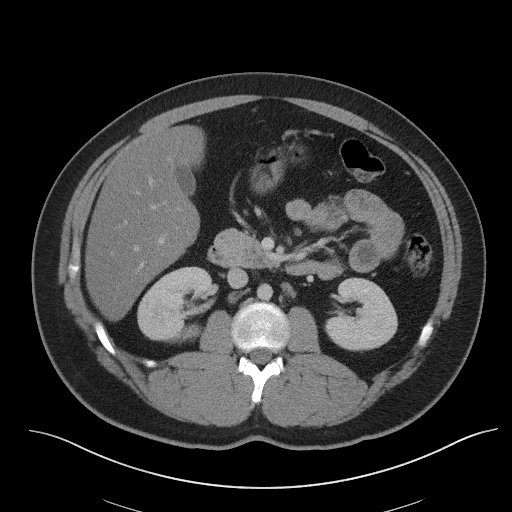
[im 69/103  bone]
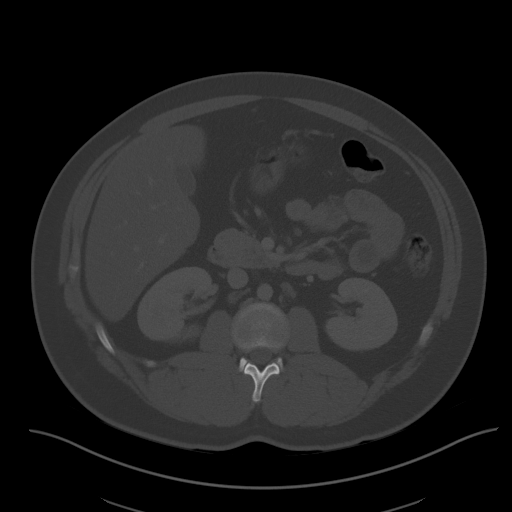
[im 73/103  soft-tissue]
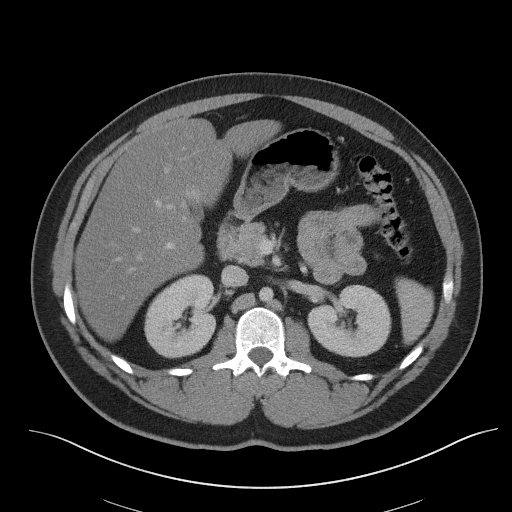
[im 81/103  soft-tissue]
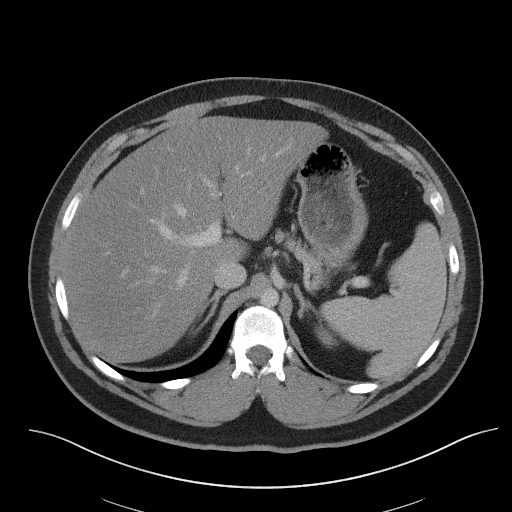
[im 90/103  soft-tissue]
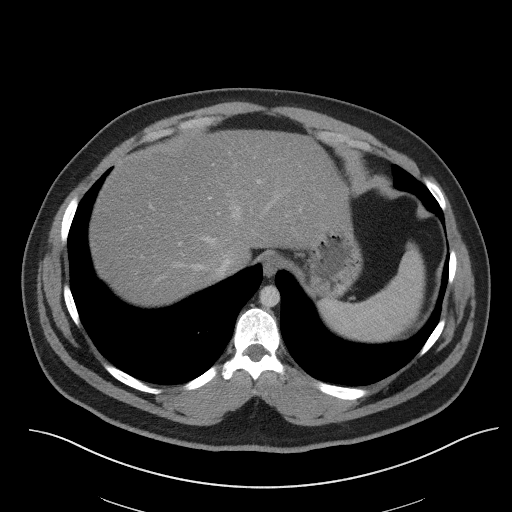
[im 98/103  soft-tissue]
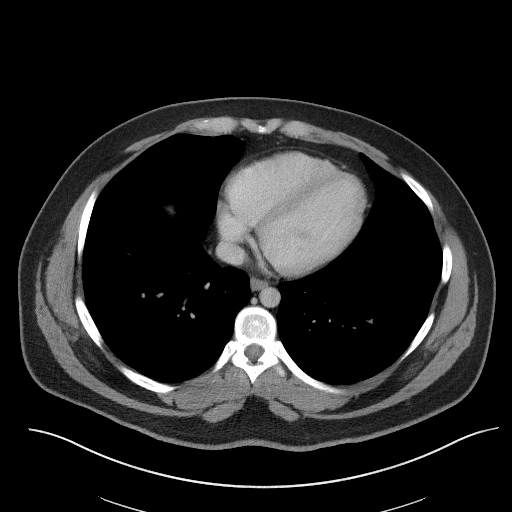

[Series 5: coronal st · coronal · 0.86mm/px · 3 of 101 slices shown]
[im 34/101  soft-tissue]
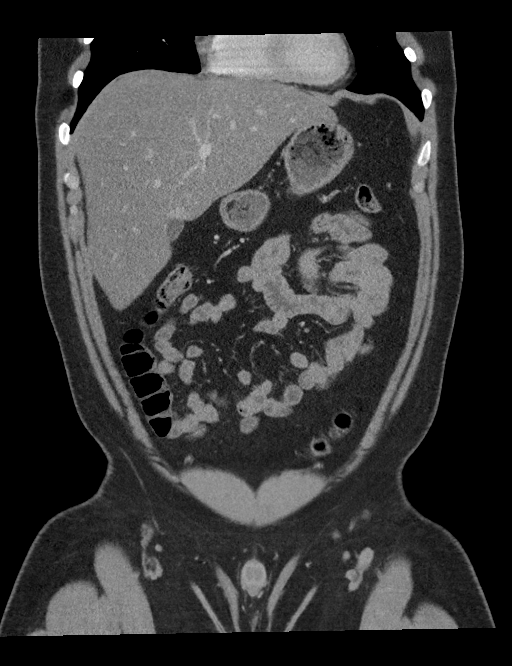
[im 45/101  soft-tissue]
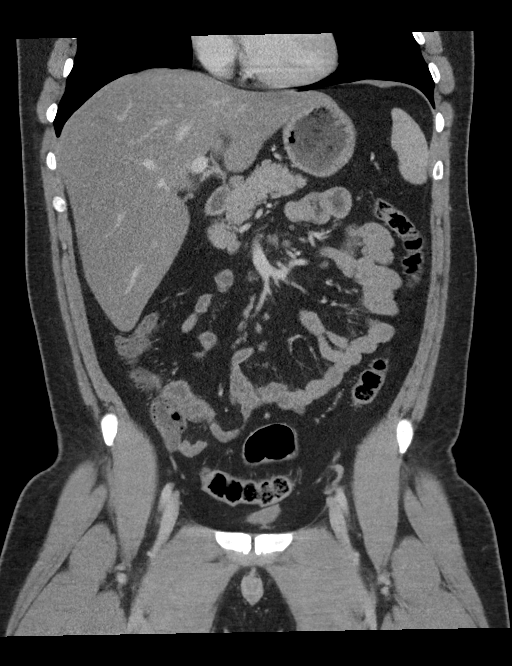
[im 56/101  soft-tissue]
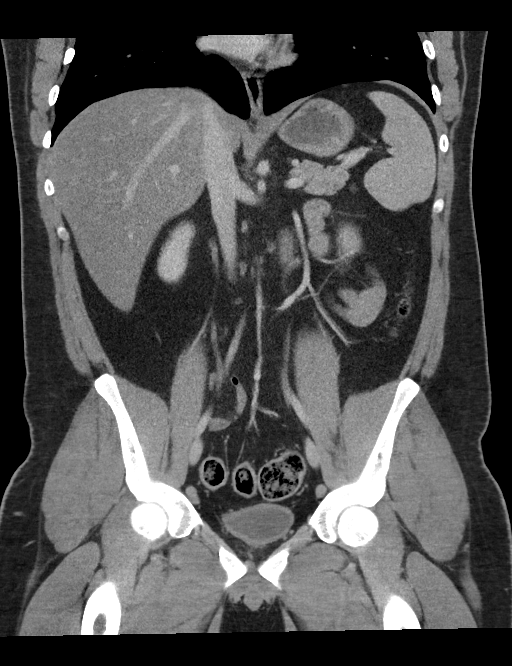

[16 of 46 positions shown; findings below may reference images not displayed]

FINDINGS: Lower chest: No acute abnormality.

Hepatobiliary: Liver is diffusely low in density suggesting fatty
infiltration. No focal mass or lesion is seen within the liver.
Gallbladder appears normal. No bile duct dilatation.

Pancreas: Unremarkable. No pancreatic ductal dilatation or
surrounding inflammatory changes.

Spleen: Normal in size without focal abnormality.

Adrenals/Urinary Tract: Adrenal glands appear normal. Kidneys are
unremarkable without mass, stone or hydronephrosis. No perinephric
fluid. No ureteral or bladder calculi identified. Bladder is
unremarkable, decompressed.

Stomach/Bowel: No dilated large or small bowel loops. No evidence of
bowel wall inflammation. Appendix is normal. Stomach is
unremarkable.

Vascular/Lymphatic: No vascular abnormality seen. No enlarged lymph
nodes seen.

Reproductive: Prostate is unremarkable.

Other: No free fluid or abscess collection. No free intraperitoneal
air.

Musculoskeletal: No acute or significant osseous findings.
IMPRESSION: 1. No acute findings within the abdomen or pelvis. No bowel
obstruction or evidence of bowel wall inflammation. No evidence of
acute solid organ abnormality. No renal or ureteral calculi.
Appendix is normal.
2. Fatty infiltration of the liver.

## 2021-07-09 ENCOUNTER — Emergency Department (HOSPITAL_COMMUNITY)
Admission: EM | Admit: 2021-07-09 | Discharge: 2021-07-09 | Disposition: A | Discharge status: Home / Self Care | Payer: BC Managed Care – PPO | Attending: Emergency Medicine | Admitting: Emergency Medicine

## 2021-07-09 ENCOUNTER — Encounter (HOSPITAL_COMMUNITY): Payer: Self-pay | Admitting: Emergency Medicine

## 2021-07-09 ENCOUNTER — Other Ambulatory Visit: Payer: Self-pay

## 2021-07-09 ENCOUNTER — Emergency Department (HOSPITAL_COMMUNITY): Payer: BC Managed Care – PPO

## 2021-07-09 DIAGNOSIS — R072 Precordial pain: Secondary | ICD-10-CM | POA: Insufficient documentation

## 2021-07-09 DIAGNOSIS — M25561 Pain in right knee: Secondary | ICD-10-CM | POA: Diagnosis not present

## 2021-07-09 DIAGNOSIS — R202 Paresthesia of skin: Secondary | ICD-10-CM | POA: Diagnosis not present

## 2021-07-09 DIAGNOSIS — R0789 Other chest pain: Secondary | ICD-10-CM

## 2021-07-09 DIAGNOSIS — R000 Tachycardia, unspecified: Secondary | ICD-10-CM | POA: Diagnosis not present

## 2021-07-09 LAB — CBC
HCT: 43.6 % (ref 39.0–52.0)
Hemoglobin: 15.7 g/dL (ref 13.0–17.0)
MCH: 28.8 pg (ref 26.0–34.0)
MCHC: 36 g/dL (ref 30.0–36.0)
MCV: 80 fL (ref 80.0–100.0)
Platelets: 268 K/uL (ref 150–400)
RBC: 5.45 MIL/uL (ref 4.22–5.81)
RDW: 13 % (ref 11.5–15.5)
WBC: 7.7 K/uL (ref 4.0–10.5)
nRBC: 0 % (ref 0.0–0.2)

## 2021-07-09 LAB — BASIC METABOLIC PANEL
Anion gap: 10 (ref 5–15)
BUN: 15 mg/dL (ref 6–20)
CO2: 27 mmol/L (ref 22–32)
Calcium: 9.6 mg/dL (ref 8.9–10.3)
Chloride: 96 mmol/L — ABNORMAL LOW (ref 98–111)
Creatinine, Ser: 0.77 mg/dL (ref 0.61–1.24)
GFR, Estimated: >60 mL/min (ref >60)
Glucose, Bld: 295 mg/dL — ABNORMAL HIGH (ref 70–99)
Potassium: 3.8 mmol/L (ref 3.5–5.1)
Sodium: 133 mmol/L — ABNORMAL LOW (ref 135–145)

## 2021-07-09 LAB — TROPONIN I (HIGH SENSITIVITY)
Troponin I (High Sensitivity): 4 ng/L
Troponin I (High Sensitivity): 4 ng/L

## 2021-07-09 NOTE — ED Triage Notes (Signed)
Patient states he was at work and around [place] today started having right hand numbness that has progressed into the left as well. States he began having pain in his right knee that goes back and forth to to left leg. Reports palpitations and "compression" to left side of chest. States when he stood up to put his jacket he lifted his arms and his chest felt heavy.

## 2021-07-09 NOTE — ED Provider Notes (Signed)
[facility] EMERGENCY DEPARTMENT Provider Note   CSN: [id number] Arrival date & time: 07/09/21  1533     History  Chief Complaint  Patient presents with   Chest Pain    Aaron Parker is a 21 y.o. male.  21 year old male presents with complaint of tingling in his right and left hands.  Patient was at work when this started.  Patient then began having some discomfort in his right knee that moved to his left leg.  This then led to feeling palpitation and some "compressive pressure" to the left side of his chest.  Symptoms have resolved.  Overall timeframe of his concerning symptoms was approximately 30 minutes.  He denies recent illness.  He denies recent fever.  He appears to be comfortable now.  The history is provided by the patient.  Chest Pain Pain location:  Substernal area Pain quality: aching   Pain radiates to:  Does not radiate Pain severity:  Mild Onset quality:  Sudden Duration:  30 minutes Timing:  Constant Progression:  Resolved Chronicity:  New     Home Medications Prior to Admission medications   Medication Sig Start Date End Date Taking? Authorizing Provider  HYDROXYZINE HCL PO Take 25 mg by mouth at bedtime as needed.    [provider]  ibuprofen (ADVIL) 200 MG tablet Take 400 mg by mouth every 6 (six) hours as needed for moderate pain.    [provider]  metFORMIN (GLUCOPHAGE) 500 MG tablet Take 1 tablet (500 mg total) by mouth 2 (two) times daily with a meal. 12/17/19 12/16/20  Nita Sickle, MD      Allergies    Codeine    Review of Systems   Review of Systems  Cardiovascular:  Positive for chest pain.  All other systems reviewed and are negative.  Physical Exam Updated Vital Signs BP 137/79    Pulse (!) 102    Temp 99 F (37.2 C)    Resp (!) 27    Ht 5\' 10"  (1.778 m)    Wt 102.1 kg    SpO2 98%    BMI 32.28 kg/m  Physical Exam Vitals and nursing note reviewed.  Constitutional:      General: He is  not in acute distress.    Appearance: Normal appearance. He is well-developed.  HENT:     Head: Normocephalic and atraumatic.  Eyes:     Conjunctiva/sclera: Conjunctivae normal.     Pupils: Pupils are equal, round, and reactive to light.  Cardiovascular:     Rate and Rhythm: Normal rate and regular rhythm.     Heart sounds: Normal heart sounds.  Pulmonary:     Effort: Pulmonary effort is normal. No respiratory distress.     Breath sounds: Normal breath sounds.  Abdominal:     General: There is no distension.     Palpations: Abdomen is soft.     Tenderness: There is no abdominal tenderness.  Musculoskeletal:        General: No deformity. Normal range of motion.     Cervical back: Normal range of motion and neck supple.  Skin:    General: Skin is warm and dry.  Neurological:     General: No focal deficit present.     Mental Status: He is alert and oriented to person, place, and time.    ED Results / Procedures / Treatments   Labs (all labs ordered are listed, but only abnormal results are displayed) Labs Reviewed  BASIC METABOLIC  PANEL - Abnormal; Notable for the following components:      Result Value   Sodium 133 (*)    Chloride 96 (*)    Glucose, Bld 295 (*)    All other components within normal limits  CBC  TROPONIN I (HIGH SENSITIVITY)  TROPONIN I (HIGH SENSITIVITY)    EKG EKG Interpretation  Date/Time:  Tuesday July 09 2021 15:43:23 EST Ventricular Rate:  111 PR Interval:  124 QRS Duration: 108 QT Interval:  340 QTC Calculation: 462 R Axis:   -3 Text Interpretation: Sinus tachycardia Possible Lateral infarct , age undetermined ST & T wave abnormality, consider anterior ischemia Abnormal ECG When compared with ECG of 16-Dec-2019 22:48, PREVIOUS ECG IS PRESENT Confirmed by Kristine Royal [phone]) on 07/09/2021 5:55:31 PM  Radiology DG Chest 2 View  Result Date: 07/09/2021 CLINICAL DATA:  Chest pain EXAM: CHEST - 2 VIEW COMPARISON:  Chest x-ray dated  July 20, 2017 FINDINGS: The heart size and mediastinal contours are within normal limits. Both lungs are clear. The visualized skeletal structures are unremarkable. IMPRESSION: No active cardiopulmonary disease. Electronically Signed   By: Allegra Lai M.D.   On: 07/09/2021 16:05    Procedures Procedures    Medications Ordered in ED Medications - No data to display  ED Course/ Medical Decision Making/ A&P                           Medical Decision Making   Medical Screen Complete  This patient presented to the ED with complaint of paresthesias, atypical chest discomfort.  This complaint involves an extensive number of treatment options. The initial differential diagnosis includes, but is not limited to, arrhythmia, metabolic abnormality, ACS  This presentation is: Acute, Previously Undiagnosed, Uncertain Prognosis, Complicated, Systemic Symptoms, and Threat to Life/Bodily Function  Patient presented with complaint of paresthesias and migratory discomfort.  Patient is improved upon evaluation in the ED  Screening labs obtained are without evidence of significant abnormality.  Specifically EKG is without evidence of acute ischemia.  Troponin is nearly undetectable.  Patient would benefit from close follow-up in the outpatient setting.  Appears to be appropriate for discharge.  Importance of close follow-up is repeatedly stressed.  Strict precautions given understood  Co morbidities that complicated the patient's evaluation  History of WPW   Additional history obtained: External records from outside sources obtained and reviewed including prior ED visits and prior Inpatient records.    Lab Tests:  I ordered and personally interpreted labs.  The pertinent results include: CBC, BMP, troponin chest x-ray   Imaging Studies ordered:  I ordered imaging studies including CXR  I independently visualized and interpreted obtained imaging which showed no acute disease I  agree with the radiologist interpretation.   Cardiac Monitoring:  The patient was maintained on a cardiac monitor.  I personally viewed and interpreted the cardiac monitor which showed an underlying rhythm of: NSR     Problem List / ED Course:  Paresthesia, atypical chest discomfort   Reevaluation:  After the interventions noted above, I reevaluated the patient and found that they have: improved   Disposition:  After consideration of the diagnostic results and the patients response to treatment, I feel that the patent would benefit from close outpatient follow-up.          Final Clinical Impression(s) / ED Diagnoses Final diagnoses:  Atypical chest pain  Paresthesia    Rx / DC Orders ED Discharge Orders  None         Wynetta FinesMessick, Tyion Boylen C, MD 07/09/21 2115

## 2021-07-09 NOTE — Discharge Instructions (Addendum)
Return for any problem.  ?

## 2021-07-09 NOTE — ED Provider Triage Note (Signed)
Emergency Medicine Provider Triage Evaluation Note  Aaron Parker , a 21 y.o. male  was evaluated in triage.  Pt complains of left sided chest pressure and palpitations, bilateral hand and leg alternating pain and numbness.  Patient states symptoms started abruptly while he was at work several hours ago, and have somewhat resolved.  He had 1 episode of emesis while driving to the ER.    Review of Systems  Positive: As above Negative: Fevers, chills, abdominal pain, syncope  Physical Exam  BP (!) 155/94    Pulse (!) 102    Temp 99 F (37.2 C)    Resp 20    SpO2 100%  Gen:   Awake, no distress   Resp:  Normal effort  MSK:   Moves extremities without difficulty  Other:    Medical Decision Making  Medically screening exam initiated at 3:40 PM.  Appropriate orders placed.  Aaron Parker was informed that the remainder of the evaluation will be completed by another provider, this initial triage assessment does not replace that evaluation, and the importance of remaining in the ED until their evaluation is complete.     Aaron Plummer, PA-C 07/09/21 1546

## 2021-09-07 ENCOUNTER — Ambulatory Visit
Admission: EM | Admit: 2021-09-07 | Discharge: 2021-09-07 | Disposition: A | Discharge status: Home / Self Care | Payer: BC Managed Care – PPO | Attending: Family Medicine | Admitting: Family Medicine

## 2021-09-07 ENCOUNTER — Encounter: Payer: Self-pay | Admitting: Emergency Medicine

## 2021-09-07 DIAGNOSIS — J029 Acute pharyngitis, unspecified: Secondary | ICD-10-CM

## 2021-09-07 DIAGNOSIS — U071 COVID-19: Secondary | ICD-10-CM

## 2021-09-07 MED ORDER — PREDNISONE 20 MG PO TABS: 20.0000 mg | ORAL_TABLET | Freq: Every day | ORAL | 0 refills | Status: AC

## 2021-09-07 MED ORDER — LIDOCAINE VISCOUS HCL 2 % MT SOLN: 15.0000 mL | OROMUCOSAL | 0 refills | Status: DC | PRN

## 2021-09-07 NOTE — ED Provider Notes (Signed)
?[facility] ? ? ? ?CSN: [id number] ?Arrival date & time: 09/07/21  1035 ? ? ?  ? ?History   ?Chief Complaint ?Chief Complaint  ?Patient presents with  ? Sore Throat  ? ? ?HPI ?Aaron Parker is a 21 y.o. male.  ? ?HPI ?Patient tested positive for COVID-19 4 days ago.  He reports that his symptoms started manifesting 3 days ago.  Yesterday he developed severe soreness of throat.  He reports he is also been exposed to strep and would like a strep test.  He endorses difficulty swallowing and pain with movement of his neck.  He denies any shortness of breath or chest pain.  Vital signs are stable ? ?Past Medical History:  ?Diagnosis Date  ? Eczema   ? Muscle strain   ? Wolff-Parkinson-White (WPW) syndrome   ? ? ?Patient Active Problem List  ? Diagnosis Date Noted  ? MDD (major depressive disorder) 09/22/2018  ? Abnormal LFTs (liver function tests)   ? Insomnia   ? ? ?Past Surgical History:  ?Procedure Laterality Date  ? ADENOIDECTOMY  2007  ? TONSILLECTOMY    ? ? ? ? ? ?Home Medications   ? ?Prior to Admission medications   ?Medication Sig Start Date End Date Taking? Authorizing Provider  ?lidocaine (XYLOCAINE) 2 % solution Use as directed 15 mLs in the mouth or throat every 3 (three) hours as needed (Throat pain). 09/07/21  Yes Scot Jun, FNP  ?predniSONE (DELTASONE) 20 MG tablet Take 1 tablet (20 mg total) by mouth daily with breakfast for 5 days. 09/07/21 09/12/21 Yes Scot Jun, FNP  ?HYDROXYZINE HCL PO Take 25 mg by mouth at bedtime as needed.    [provider]  ?ibuprofen (ADVIL) 200 MG tablet Take 400 mg by mouth every 6 (six) hours as needed for moderate pain.    [provider]  ?metFORMIN (GLUCOPHAGE) 500 MG tablet Take 1 tablet (500 mg total) by mouth 2 (two) times daily with a meal. 12/17/19 09/07/21  Rudene Re, MD  ? ? ?Family History ?History reviewed. No pertinent family history. ? ?Social History ?Social History  ? ?Tobacco Use  ? Smoking status: Never  ?   Passive exposure: Yes  ? Smokeless tobacco: Never  ?Vaping Use  ? Vaping Use: Never used  ?Substance Use Topics  ? Alcohol use: Yes  ? ? ? ?Allergies   ?Codeine ? ? ?Review of Systems ?Review of Systems ?Pertinent negatives listed in HPI . ? ? ?Physical Exam ?Triage Vital Signs ?ED Triage Vitals [09/07/21 1133]  ?Enc Vitals Group  ?   BP   ?   Pulse   ?   Resp   ?   Temp   ?   Temp src   ?   SpO2   ?   Weight   ?   Height   ?   Head Circumference   ?   Peak Flow   ?   Pain Score 8  ?   Pain Loc   ?   Pain Edu?   ?   Excl. in [place]?   ? ?No data found. ? ?Updated Vital Signs ?BP (!) 145/99 (BP Location: Left Arm)   Pulse 100   Temp 98.5 ?F (36.9 ?C) (Oral)   Resp 18   SpO2 97%  ? ?Visual Acuity ?Right Eye Distance:   ?Left Eye Distance:   ?Bilateral Distance:   ? ?Right Eye Near:   ?Left Eye Near:    ?  Bilateral Near:    ? ?Physical Exam ?Constitutional:   ?   Appearance: He is well-developed.  ?HENT:  ?   Head: Normocephalic and atraumatic.  ?   Nose: Congestion present.  ?Eyes:  ?   Extraocular Movements: Extraocular movements intact.  ?   Pupils: Pupils are equal, round, and reactive to light.  ?Cardiovascular:  ?   Rate and Rhythm: Regular rhythm. Tachycardia present.  ?Pulmonary:  ?   Effort: Pulmonary effort is normal.  ?   Breath sounds: Normal breath sounds.  ?Musculoskeletal:  ?   Cervical back: Pain with movement present.  ?Lymphadenopathy:  ?   Cervical: Cervical adenopathy present.  ?Skin: ?   General: Skin is warm and dry.  ?   Capillary Refill: Capillary refill takes less than 2 seconds.  ?Neurological:  ?   General: No focal deficit present.  ?   Mental Status: He is alert and oriented to person, place, and time.  ?Psychiatric:     ?   Mood and Affect: Mood normal.     ?   Behavior: Behavior normal.     ?   Thought Content: Thought content normal.     ?   Judgment: Judgment normal.  ? ? ? ?UC Treatments / Results  ?Labs ?(all labs ordered are listed, but only abnormal results are displayed) ?Labs  Reviewed  ?POCT RAPID STREP A (OFFICE)  ? ? ?EKG ? ? ?Radiology ?No results found. ? ?Procedures ?Procedures (including critical care time) ? ?Medications Ordered in UC ?Medications - No data to display ? ?Initial Impression / Assessment and Plan / UC Course  ?I have reviewed the triage vital signs and the nursing notes. ? ?Pertinent labs & imaging results that were available during my care of the patient were reviewed by me and considered in my medical decision making (see chart for details). ? ?  ?Acute pharyngitis secondary to COVID-19 viral infection. ?Treatment with prednisone and lidocaine viscous as needed for throat pain. Force fluids.  Throat pain related to COVID is self-limiting and will resolve in time.  Follow-up as needed. ?Final Clinical Impressions(s) / UC Diagnoses  ? ?Final diagnoses:  ?Acute pharyngitis, unspecified etiology  ?COVID-19 virus infection  ? ?Discharge Instructions   ?None ?  ? ?ED Prescriptions   ? ? Medication Sig Dispense Auth. Provider  ? predniSONE (DELTASONE) 20 MG tablet Take 1 tablet (20 mg total) by mouth daily with breakfast for 5 days. 5 tablet Scot Jun, FNP  ? lidocaine (XYLOCAINE) 2 % solution Use as directed 15 mLs in the mouth or throat every 3 (three) hours as needed (Throat pain). 50 mL Scot Jun, FNP  ? ?  ? ?PDMP not reviewed this encounter. ?  ?Scot Jun, FNP ?09/07/21 1213 ? ?

## 2021-09-07 NOTE — ED Triage Notes (Signed)
Pt presents with a ST exposed to strep his sxs started yesterday. Pt dx with covid last week  ?

## 2021-11-08 ENCOUNTER — Emergency Department
Admission: EM | Admit: 2021-11-08 | Discharge: 2021-11-08 | Disposition: A | Discharge status: Home / Self Care | Payer: Self-pay | Attending: Emergency Medicine | Admitting: Emergency Medicine

## 2021-11-08 ENCOUNTER — Other Ambulatory Visit: Payer: Self-pay

## 2021-11-08 DIAGNOSIS — L02213 Cutaneous abscess of chest wall: Secondary | ICD-10-CM | POA: Insufficient documentation

## 2021-11-08 DIAGNOSIS — L0291 Cutaneous abscess, unspecified: Secondary | ICD-10-CM

## 2021-11-08 MED ORDER — SULFAMETHOXAZOLE-TRIMETHOPRIM 800-160 MG PO TABS: 1.0000 | ORAL_TABLET | Freq: Two times a day (BID) | ORAL | 0 refills | Status: AC

## 2021-11-08 MED ORDER — SULFAMETHOXAZOLE-TRIMETHOPRIM 800-160 MG PO TABS
1.0000 | ORAL_TABLET | Freq: Once | ORAL | Status: AC
  Administered 2021-11-08: 1 via ORAL
  Filled 2021-11-08: qty 1

## 2021-11-08 NOTE — Discharge Instructions (Addendum)
Please take Tylenol and ibuprofen/Advil for your pain.  It is safe to take them together, or to alternate them every few hours.  Take up to 1000mg  of Tylenol at a time, up to 4 times per day.  Do not take more than 4000 mg of Tylenol in 24 hours.  For ibuprofen, take 400-600 mg, 3 - 4 times per day.  Take the Bactrim antibiotic twice daily for the next 1 week.  Finish all 14 pills even if the redness is getting better.  The packing material and there should fall out after a few days.  Try not to pull it out preemptively.

## 2021-11-08 NOTE — ED Triage Notes (Signed)
Pt has abscess to R center chest that has been getting bigger x 1 week. Pt denies any chills/fevers. Pt denies any drainage, but there is a scab in middle of abscess.

## 2021-11-08 NOTE — ED Provider Notes (Addendum)
[facility] Provider Note    Event Date/Time   First MD Initiated Contact with Patient 11/08/21 1716     (approximate)   History   Abscess   HPI  Aaron Parker is a 21 y.o. male who presents to the ED for evaluation of Abscess   Patient presents to the ED for evaluation of an abscess on his chest wall that has been worsening for the past 1 week.  He reports he had a subcutaneous cyst to this area that he thought was removed remotely when he was a child by surgical friend.  Denies any previous abscesses or infections to this area.   Reports in the past week it has dramatically grown in size, been red and painful to the touch.  Denies any fevers or systemic symptoms.   Physical Exam   Triage Vital Signs: ED Triage Vitals  Enc Vitals Group     BP 11/08/21 1559 (!) 139/93     Pulse Rate 11/08/21 1559 87     Resp 11/08/21 1559 16     Temp 11/08/21 1559 99.1 F (37.3 C)     Temp Source 11/08/21 1559 Oral     SpO2 11/08/21 1559 96 %     Weight 11/08/21 1600 232 lb (105.2 kg)     Height 11/08/21 1600 5\' 11"  (1.803 m)     Head Circumference --      Peak Flow --      Pain Score 11/08/21 1600 3     Pain Loc --      Pain Edu? --      Excl. in [place]? --     Most recent vital signs: Vitals:   11/08/21 1559  BP: (!) 139/93  Pulse: 87  Resp: 16  Temp: 99.1 F (37.3 C)  SpO2: 96%    General: Awake, no distress.  CV:  Good peripheral perfusion.  Resp:  Normal effort.  Abd:  No distention.  MSK:  As pictured below, to his mid chest just right of the midline is a 2 cm soft tissue mass that is fluctuant, painful.  No active drainage.  A 1 cm halo/border of erythematous induration. Neuro:  No focal deficits appreciated. Other:       ED Results / Procedures / Treatments   Labs (all labs ordered are listed, but only abnormal results are displayed) Labs Reviewed - No data to display  EKG   RADIOLOGY   Official radiology report(s): No  results found.  PROCEDURES and INTERVENTIONS:  .08/02/23Incision and Drainage  Date/Time: 11/08/2021 5:42 PM Performed by: 01/08/2022, MD Authorized by: Delton Prairie, MD   Consent:    Consent obtained:  Verbal   Consent given by:  Patient   Risks, benefits, and alternatives were discussed: yes   Location:    Type:  Abscess   Size:  2cm   Location:  Trunk   Trunk location:  Chest Pre-procedure details:    Skin preparation:  Povidone-iodine Sedation:    Sedation type:  None Anesthesia:    Anesthesia method:  None Procedure type:    Complexity:  Simple Procedure details:    Ultrasound guidance: no     Needle aspiration: no     Incision types:  Stab incision   Wound management:  Probed and deloculated   Drainage:  Bloody and purulent   Drainage amount:  Moderate   Wound treatment:  Drain placed   Packing materials:  1/4 in iodoform gauze Post-procedure details:  Procedure completion:  Tolerated well, no immediate complications  Medications  sulfamethoxazole-trimethoprim (BACTRIM DS) 800-160 MG per tablet 1 tablet (has no administration in time range)     IMPRESSION / MDM / ASSESSMENT AND PLAN / ED COURSE  I reviewed the triage vital signs and the nursing notes.  Differential diagnosis includes, but is not limited to, abscess, cellulitis, cancer  Pleasant 21 year old male presents to the ED with evidence of a superficial abscess to his chest wall requiring I&D and systemic antibiotics.  No signs of sepsis or systemic illness.  I performed empiric I&D with good return of purulent and slightly bloody discharge.  Place Quarter inch iodoform packing, and started the patient on Bactrim.  We discussed pain control at home, local wound care and return precautions.      FINAL CLINICAL IMPRESSION(S) / ED DIAGNOSES   Final diagnoses:  Abscess     Rx / DC Orders   ED Discharge Orders          Ordered    sulfamethoxazole-trimethoprim (BACTRIM DS) 800-160 MG tablet  2 times  daily        11/08/21 1734             Note:  This document was prepared using Dragon voice recognition software and may include unintentional dictation errors.   Delton Prairie, MD 11/08/21 1746    Delton Prairie, MD 11/08/21 [phone]

## 2022-08-03 NOTE — Progress Notes (Unsigned)
New patient visit   Patient: Aaron Parker   DOB: [birth year]   22 y.o. Male  MRN: [id number] Visit Date: 08/04/2022  Today's healthcare provider: Eulis Palmateer, MD   No chief complaint on file.  Subjective    Aaron Parker is a 22 y.o. male who presents today as a new patient to establish care.  HPI  ***  Past Medical History:  Diagnosis Date   Eczema    Muscle strain    Wolff-Parkinson-White (WPW) syndrome    Past Surgical History:  Procedure Laterality Date   ADENOIDECTOMY  2007   TONSILLECTOMY     No family status information on file.   No family history on file. Social History   Socioeconomic History   Marital status: Single    Spouse name: Not on file   Number of children: Not on file   Years of education: Not on file   Highest education level: Not on file  Occupational History   Not on file  Tobacco Use   Smoking status: Never    Passive exposure: Yes   Smokeless tobacco: Never  Vaping Use   Vaping Use: Never used  Substance and Sexual Activity   Alcohol use: Yes   Drug use: Not on file   Sexual activity: Not on file  Other Topics Concern   Not on file  Social History Narrative   Not on file   Social Determinants of Health   Financial Resource Strain: Not on file  Food Insecurity: Not on file  Transportation Needs: Not on file  Physical Activity: Not on file  Stress: Not on file  Social Connections: Not on file   Outpatient Medications Prior to Visit  Medication Sig   HYDROXYZINE HCL PO Take 25 mg by mouth at bedtime as needed.   ibuprofen (ADVIL) 200 MG tablet Take 400 mg by mouth every 6 (six) hours as needed for moderate pain.   lidocaine (XYLOCAINE) 2 % solution Use as directed 15 mLs in the mouth or throat every 3 (three) hours as needed (Throat pain).   metFORMIN (GLUCOPHAGE) 500 MG tablet Take 1 tablet (500 mg total) by mouth 2 (two) times daily with a meal.   No facility-administered medications prior to visit.    Allergies  Allergen Reactions   Codeine Hives    Immunization History  Administered Date(s) Administered   PFIZER(Purple Top)SARS-COV-2 Vaccination 09/22/2019, 10/17/2019    Health Maintenance  Topic Date Due   HPV VACCINES (1 - Male 2-dose series) Never done   HIV Screening  Never done   DTaP/Tdap/Td (1 - Tdap) Never done   INFLUENZA VACCINE  Never done   COVID-19 Vaccine (3 - 2023-24 season) 02/07/2022   Hepatitis C Screening  Completed    Patient Care Team: Pcp, No as PCP - General  Review of Systems  {Labs  Heme  Chem  Endocrine  Serology  Results Review (optional):23779}   Objective    There were no vitals taken for this visit. {Show previous vital signs (optional):23777}  Physical Exam ***  Depression Screen     No data to display         No results found for any visits on 08/04/22.  Assessment & Plan      Problem List Items Addressed This Visit   None    No follow-ups on file.      The entirety of the information documented in the History of Present Illness, Review of Systems and Physical Exam  were personally obtained by me. Portions of this information were initially documented by *** . I, Eulis Blower, MD have reviewed the documentation above for thoroughness and accuracy.      Eulis Orlich, MD  [facility] [phone] (phone) [phone] (fax)  [place]

## 2022-08-04 ENCOUNTER — Encounter: Payer: Self-pay | Admitting: Family Medicine

## 2022-08-04 ENCOUNTER — Ambulatory Visit: Payer: Managed Care, Other (non HMO) | Admitting: Family Medicine

## 2022-08-04 VITALS — BP 129/78 | HR 77 | Temp 98.8°F | Resp 16 | Ht 70.0 in | Wt 219.0 lb

## 2022-08-04 DIAGNOSIS — L729 Follicular cyst of the skin and subcutaneous tissue, unspecified: Secondary | ICD-10-CM | POA: Diagnosis not present

## 2022-08-04 DIAGNOSIS — R1012 Left upper quadrant pain: Secondary | ICD-10-CM

## 2022-08-04 DIAGNOSIS — Z7689 Persons encountering health services in other specified circumstances: Secondary | ICD-10-CM | POA: Diagnosis not present

## 2022-08-04 NOTE — Assessment & Plan Note (Signed)
Issue has been present for 3 months  Worsening frequency of symptoms  Patient is concerned for IBS with diarrhea  Recommended obtaining lab work, ordered CMP,CBC,lipase as well as RUQ [place]  Will follow up in 1 month after reviewing labs and imaging  Discussed that this could be related to NSAID use, trapped gas, constipation or dyspepsia

## 2022-08-04 NOTE — Patient Instructions (Signed)
It was a pleasure to see you today!  Thank you for choosing [facility] for your primary care.   Aaron Parker was seen for establishing care, skin concern, abdominal pain.   Our plans for today were: For your abdominal pain, we will collect blood work today as well as ordered an ultrasound to evaluate potential causes for your pain. Our office will follow-up with you once we have the results of your blood work and imaging. I have placed a referral to dermatology for further evaluation of the area on the back of your neck.  Please be on the look out for a call to schedule an appointment and notify our office if you do not hear anything in the next 2 weeks.    You should return to our clinic in 2 weeks for abdominal pain.   Best Wishes,   Dr. Quentin Cornwall

## 2022-08-04 NOTE — Assessment & Plan Note (Signed)
Chronic issue with intermittent cysts  He would like to have the area on the back of his neck evaluated by dermatology  Referral submitted for dermatology today

## 2022-08-04 NOTE — Assessment & Plan Note (Signed)
Welcomed patient to [facility]  Reviewed patient's medical history, medications, surgical and social history Discussed roles and expectations primary care physician-patient relationship

## 2022-08-05 ENCOUNTER — Telehealth: Payer: Self-pay | Admitting: Family Medicine

## 2022-08-05 LAB — COMPREHENSIVE METABOLIC PANEL
ALT: 84 IU/L — ABNORMAL HIGH (ref 0–44)
AST: 35 IU/L (ref 0–40)
Albumin/Globulin Ratio: 1.8 (ref 1.2–2.2)
Albumin: 4.4 g/dL (ref 4.3–5.2)
Alkaline Phosphatase: 65 IU/L (ref 44–121)
BUN/Creatinine Ratio: 14 (ref 9–20)
BUN: 9 mg/dL (ref 6–20)
Bilirubin Total: 0.3 mg/dL (ref 0.0–1.2)
CO2: 23 mmol/L (ref 20–29)
Calcium: 9.9 mg/dL (ref 8.7–10.2)
Chloride: 100 mmol/L (ref 96–106)
Creatinine, Ser: 0.63 mg/dL — ABNORMAL LOW (ref 0.76–1.27)
Globulin, Total: 2.4 g/dL (ref 1.5–4.5)
Glucose: 377 mg/dL — ABNORMAL HIGH (ref 70–99)
Potassium: 4.4 mmol/L (ref 3.5–5.2)
Sodium: 138 mmol/L (ref 134–144)
Total Protein: 6.8 g/dL (ref 6.0–8.5)
eGFR: 136 mL/min/{1.73_m2} (ref >59)

## 2022-08-05 LAB — CBC
Hematocrit: 41.9 % (ref 37.5–51.0)
Hemoglobin: 14 g/dL (ref 13.0–17.7)
MCH: 27 pg (ref 26.6–33.0)
MCHC: 33.4 g/dL (ref 31.5–35.7)
MCV: 81 fL (ref 79–97)
Platelets: 326 10*3/uL (ref 150–450)
RBC: 5.18 x10E6/uL (ref 4.14–5.80)
RDW: 13.9 % (ref 11.6–15.4)
WBC: 7.8 10*3/uL (ref 3.4–10.8)

## 2022-08-05 LAB — LIPASE: Lipase: 53 U/L (ref 13–78)

## 2022-08-05 NOTE — Telephone Encounter (Signed)
Copied from [place] [phone]. Topic: General - Other >> Aug 05, 2022 10:31 AM Everette C wrote: Reason for CRM: The patient has called to request orders for an A1C check   The patient has stressed the urgency of their request   Please contact the patient further when possible

## 2022-08-05 NOTE — Telephone Encounter (Signed)
Spoke with patient about his lab results. Called [facility] to add A1C

## 2022-08-07 ENCOUNTER — Ambulatory Visit: Payer: Managed Care, Other (non HMO) | Admitting: Family Medicine

## 2022-08-07 ENCOUNTER — Encounter: Payer: Self-pay | Admitting: Family Medicine

## 2022-08-07 VITALS — BP 134/79 | HR 80 | Resp 16 | Wt 219.2 lb

## 2022-08-07 DIAGNOSIS — E1165 Type 2 diabetes mellitus with hyperglycemia: Secondary | ICD-10-CM

## 2022-08-07 DIAGNOSIS — E1065 Type 1 diabetes mellitus with hyperglycemia: Secondary | ICD-10-CM

## 2022-08-07 MED ORDER — LANCET DEVICE MISC: 6 refills | Status: DC

## 2022-08-07 MED ORDER — INSULIN GLARGINE 100 UNIT/ML SOLOSTAR PEN: 12.0000 [IU] | PEN_INJECTOR | Freq: Every day | SUBCUTANEOUS | 1 refills | Status: DC

## 2022-08-07 MED ORDER — BLOOD GLUCOSE TEST VI STRP: 1.0000 | ORAL_STRIP | Freq: Two times a day (BID) | 6 refills | Status: AC

## 2022-08-07 MED ORDER — INSULIN PEN NEEDLE 31G X 8 MM MISC: 1.0000 | Freq: Two times a day (BID) | 6 refills | Status: DC

## 2022-08-07 MED ORDER — LANCETS MISC. MISC: 6 refills | Status: DC

## 2022-08-07 MED ORDER — BLOOD GLUCOSE MONITORING SUPPL DEVI: 1.0000 | Freq: Three times a day (TID) | 0 refills | Status: DC

## 2022-08-07 MED ORDER — METFORMIN HCL ER 750 MG PO TB24: 750.0000 mg | ORAL_TABLET | Freq: Every day | ORAL | 3 refills | Status: DC

## 2022-08-07 NOTE — Progress Notes (Signed)
I,Aaron Parker,acting as a scribe for [employer], MD.,have documented all relevant documentation on the behalf of Aaron Mcpeek, MD,as directed by  Aaron Alberto, MD while in the presence of Aaron Rozycki, MD.   Established patient visit   Patient: Aaron Parker   DOB: 05/08/2001   22 y.o. Male  MRN: [id number] Visit Date: 08/07/2022  Today's healthcare provider: Eulis Bade, MD   Chief Complaint  Patient presents with   Diabetes   Subjective    HPI  Patient here to go over lab results and discuss diabetes management treatment.  Diabetes Mellitus Type II, Initial Visit: Patient here for an initial evaluation of Type 2 diabetes   The patient was initially diagnosed with Type 2 diabetes mellitus based on the following criteria:  A1c > 6.5, A1c was 11.4  Lab Results  Component Value Date   HGBA1C 11.4 (H) 08/04/2022   .  Known diabetic complications: none Cardiovascular risk factors: diabetes mellitus and male gender. Current diabetic medications include none.   He was taking metformin in the past but states that the medication caused him to have bad diarrhea so he stopped, this was in 2021   Weight trend: decreasing steadily Prior visit with dietician: no  Is He on ACE inhibitor or angiotensin II receptor blocker?  No   Wt Readings from Last 3 Encounters:  08/07/22 219 lb 3.2 oz (99.4 kg)  08/04/22 219 lb (99.3 kg)  11/08/21 232 lb (105.2 kg)    Medications: Outpatient Medications Prior to Visit  Medication Sig   HYDROXYZINE HCL PO Take 25 mg by mouth at bedtime as needed.   ibuprofen (ADVIL) 200 MG tablet Take 400 mg by mouth every 6 (six) hours as needed for moderate pain.   lidocaine (XYLOCAINE) 2 % solution Use as directed 15 mLs in the mouth or throat every 3 (three) hours as needed (Throat pain).   [DISCONTINUED] metFORMIN (GLUCOPHAGE) 500 MG tablet Take 1 tablet (500 mg total) by mouth 2  (two) times daily with a meal.   No facility-administered medications prior to visit.    Review of Systems     Objective    BP 134/79 (BP Location: Right Arm, Patient Position: Sitting, Cuff Size: Normal)   Pulse 80   Resp 16   Wt 219 lb 3.2 oz (99.4 kg)   BMI 31.45 kg/m    Physical Exam Vitals reviewed.  Constitutional:      General: He is not in acute distress.    Appearance: Normal appearance. He is not ill-appearing, toxic-appearing or diaphoretic.  Eyes:     Conjunctiva/sclera: Conjunctivae normal.  Cardiovascular:     Rate and Rhythm: Normal rate and regular rhythm.     Pulses: Normal pulses.     Heart sounds: Normal heart sounds. No murmur heard.    No friction rub. No gallop.  Pulmonary:     Effort: Pulmonary effort is normal. No respiratory distress.     Breath sounds: Normal breath sounds. No stridor. No wheezing, rhonchi or rales.  Abdominal:     General: Bowel sounds are normal. There is no distension.     Palpations: Abdomen is soft.     Tenderness: There is no abdominal tenderness.  Musculoskeletal:     Right lower leg: No edema.     Left lower leg: No edema.  Skin:    Findings: No erythema or rash.  Neurological:     Mental Status: He is alert and oriented to person,  place, and time.       Results for orders placed or performed in visit on 08/07/22  Insulin and C-Peptide  Result Value Ref Range   INSULIN 60.1 (H) 2.6 - 24.9 uIU/mL   C-Peptide 6.2 (H) 1.1 - 4.4 ng/mL    Assessment & Plan     Problem List Items Addressed This Visit       Endocrine   Hyperglycemia due to diabetes mellitus ([place]) - Primary    Updated diagnosis, previously diagnosed without follow up in 2021, patient stopped medication over the last three years  Discussed dietary recommendations, encouraged the patient to discuss coverage for nutrition services with his insurance prior to placing order for diabetes nutrition referral, patient voiced understanding  Will start  the patient on insulin, lantus 12 units at bed time nightly  Will also start metformin XR '750mg'$  daily, counseled him to DC this if it causes severe GI upset again, he voiced agreement  Will have patient follow up in 2 weeks for medication tolerance  See AVS for dietary recommendations  Will also order c peptide and insulin levels today to assess whether this is type 1 or type 2 since patient has been diagnosed at young age        Relevant Medications   insulin glargine (LANTUS) 100 UNIT/ML Solostar Pen   metFORMIN (GLUCOPHAGE-XR) 750 MG 24 hr tablet   Other Relevant Orders   Insulin and C-Peptide (Completed)     Return in about 2 weeks (around 08/21/2022) for diabetes .        The entirety of the information documented in the History of Present Illness, Review of Systems and Physical Exam were personally obtained by me. Portions of this information were initially documented by Lyndel Pleasure, CMA . I, Aaron Dornfeld, MD have reviewed the documentation above for thoroughness and accuracy.      Aaron Towe, MD  [facility] [phone] (phone) [phone] (fax)  [place]

## 2022-08-07 NOTE — Patient Instructions (Addendum)
It was a pleasure to see you today!  Thank you for choosing [facility] for your primary care.   Aaron Parker was seen for new diabetes diagnosis.   Our plans for today were: I have sent prescriptions for you to start taking 12 units of insulin every night.  Please check your blood sugar in the morning while fasting with goal of less than 120. Please also check your blood sugar 2 hours after your largest meal of the day with goal of less than 180.  I have prescribed a different formulation of the metformin for you to start, Please take '750mg'$  daily with breakfast. Please notify our office if you have severe upset stomach after starting the metformin.  Please discuss if nutrition services would be included with your insurance coverage as I think this would be a beneficial service to help with dietary management.   You should return to our clinic in 2 weeks for diabetes.   Best Wishes,   Dr. Quentin Cornwall   Diabetes Mellitus and Nutrition, Adult When you have diabetes, or diabetes mellitus, it is very important to have healthy eating habits because your blood sugar (glucose) levels are greatly affected by what you eat and drink. Eating healthy foods in the right amounts, at about the same times every day, can help you: Manage your blood glucose. Lower your risk of heart disease. Improve your blood pressure. Reach or maintain a healthy weight. What can affect my meal plan? Every person with diabetes is different, and each person has different needs for a meal plan. Your health care provider may recommend that you work with a dietitian to make a meal plan that is best for you. Your meal plan may vary depending on factors such as: The calories you need. The medicines you take. Your weight. Your blood glucose, blood pressure, and cholesterol levels. Your activity level. Other health conditions you have, such as heart or kidney disease. How do carbohydrates affect  me? Carbohydrates, also called carbs, affect your blood glucose level more than any other type of food. Eating carbs raises the amount of glucose in your blood. It is important to know how many carbs you can safely have in each meal. This is different for every person. Your dietitian can help you calculate how many carbs you should have at each meal and for each snack. How does alcohol affect me? Alcohol can cause a decrease in blood glucose (hypoglycemia), especially if you use insulin or take certain diabetes medicines by mouth. Hypoglycemia can be a life-threatening condition. Symptoms of hypoglycemia, such as sleepiness, dizziness, and confusion, are similar to symptoms of having too much alcohol. Do not drink alcohol if: Your health care provider tells you not to drink. You are pregnant, may be pregnant, or are planning to become pregnant. If you drink alcohol: Limit how much you have to: 0-1 drink a day for women. 0-2 drinks a day for men. Know how much alcohol is in your drink. In the [place] one drink equals one 12 oz bottle of beer (355 mL), one 5 oz glass of wine (148 mL), or one 1 oz glass of hard liquor (44 mL). Keep yourself hydrated with water, diet soda, or unsweetened iced tea. Keep in mind that regular soda, juice, and other mixers may contain a lot of sugar and must be counted as carbs. What are tips for following this plan?  Reading food labels Start by checking the serving size on the Nutrition Facts label of  packaged foods and drinks. The number of calories and the amount of carbs, fats, and other nutrients listed on the label are based on one serving of the item. Many items contain more than one serving per package. Check the total grams (g) of carbs in one serving. Check the number of grams of saturated fats and trans fats in one serving. Choose foods that have a low amount or none of these fats. Check the number of milligrams (mg) of salt (sodium) in one serving. Most  people should limit total sodium intake to less than 2,300 mg per day. Always check the nutrition information of foods labeled as "low-fat" or "nonfat." These foods may be higher in added sugar or refined carbs and should be avoided. Talk to your dietitian to identify your daily goals for nutrients listed on the label. Shopping Avoid buying canned, pre-made, or processed foods. These foods tend to be high in fat, sodium, and added sugar. Shop around the outside edge of the grocery store. This is where you will most often find fresh fruits and vegetables, bulk grains, fresh meats, and fresh dairy products. Cooking Use low-heat cooking methods, such as baking, instead of high-heat cooking methods, such as deep frying. Cook using healthy oils, such as olive, canola, or sunflower oil. Avoid cooking with butter, cream, or high-fat meats. Meal planning Eat meals and snacks regularly, preferably at the same times every day. Avoid going long periods of time without eating. Eat foods that are high in fiber, such as fresh fruits, vegetables, beans, and whole grains. Eat 4-6 oz (112-168 g) of lean protein each day, such as lean meat, chicken, fish, eggs, or tofu. One ounce (oz) (28 g) of lean protein is equal to: 1 oz (28 g) of meat, chicken, or fish. 1 egg.  cup (62 g) of tofu. Eat some foods each day that contain healthy fats, such as avocado, nuts, seeds, and fish. What foods should I eat? Fruits Berries. Apples. Oranges. Peaches. Apricots. Plums. Grapes. Mangoes. Papayas. Pomegranates. Kiwi. Cherries. Vegetables Leafy greens, including lettuce, spinach, kale, chard, collard greens, mustard greens, and cabbage. Beets. Cauliflower. Broccoli. Carrots. Green beans. Tomatoes. Peppers. Onions. Cucumbers. Brussels sprouts. Grains Whole grains, such as whole-wheat or whole-grain bread, crackers, tortillas, cereal, and pasta. Unsweetened oatmeal. Quinoa. Brown or wild rice. Meats and other  proteins Seafood. Poultry without skin. Lean cuts of poultry and beef. Tofu. Nuts. Seeds. Dairy Low-fat or fat-free dairy products such as milk, yogurt, and cheese. The items listed above may not be a complete list of foods and beverages you can eat and drink. Contact a dietitian for more information. What foods should I avoid? Fruits Fruits canned with syrup. Vegetables Canned vegetables. Frozen vegetables with butter or cream sauce. Grains Refined white flour and flour products such as bread, pasta, snack foods, and cereals. Avoid all processed foods. Meats and other proteins Fatty cuts of meat. Poultry with skin. Breaded or fried meats. Processed meat. Avoid saturated fats. Dairy Full-fat yogurt, cheese, or milk. Beverages Sweetened drinks, such as soda or iced tea. The items listed above may not be a complete list of foods and beverages you should avoid. Contact a dietitian for more information. Questions to ask a health care provider Do I need to meet with a certified diabetes care and education specialist? Do I need to meet with a dietitian? What number can I call if I have questions? When are the best times to check my blood glucose? Where to find more information: American Diabetes  Association: diabetes.org Academy of Nutrition and Dietetics: eatright.[employer] of Diabetes and Digestive and Kidney Diseases: [employer]is Association of Diabetes Care & Education Specialists: diabeteseducator.org Summary It is important to have healthy eating habits because your blood sugar (glucose) levels are greatly affected by what you eat and drink. It is important to use alcohol carefully. A healthy meal plan will help you manage your blood glucose and lower your risk of heart disease. Your health care provider may recommend that you work with a dietitian to make a meal plan that is best for you. This information is not intended to replace advice given to you by your health  care provider. Make sure you discuss any questions you have with your health care provider. Document Revised: 12/28/2019 Document Reviewed: 12/28/2019 Elsevier Patient Education  [place].

## 2022-08-08 ENCOUNTER — Telehealth: Payer: Self-pay | Admitting: Family Medicine

## 2022-08-08 ENCOUNTER — Encounter: Payer: Self-pay | Admitting: Family Medicine

## 2022-08-08 DIAGNOSIS — E1165 Type 2 diabetes mellitus with hyperglycemia: Secondary | ICD-10-CM | POA: Insufficient documentation

## 2022-08-08 DIAGNOSIS — K219 Gastro-esophageal reflux disease without esophagitis: Secondary | ICD-10-CM | POA: Insufficient documentation

## 2022-08-08 LAB — INSULIN AND C-PEPTIDE, SERUM
C-Peptide: 6.2 ng/mL — ABNORMAL HIGH (ref 1.1–4.4)
INSULIN: 60.1 u[IU]/mL — ABNORMAL HIGH (ref 2.6–24.9)

## 2022-08-08 NOTE — Assessment & Plan Note (Signed)
Updated diagnosis, previously diagnosed without follow up in 2021, patient stopped medication over the last three years  Discussed dietary recommendations, encouraged the patient to discuss coverage for nutrition services with his insurance prior to placing order for diabetes nutrition referral, patient voiced understanding  Will start the patient on insulin, lantus 12 units at bed time nightly  Will also start metformin XR '750mg'$  daily, counseled him to DC this if it causes severe GI upset again, he voiced agreement  Will have patient follow up in 2 weeks for medication tolerance  See AVS for dietary recommendations  Will also order c peptide and insulin levels today to assess whether this is type 1 or type 2 since patient has been diagnosed at young age

## 2022-08-08 NOTE — Telephone Encounter (Signed)
Patient is trying to get their Lancets that was prescribed to them and the pharmacy said they needed someone from the office to call and let them know which lancets it is.

## 2022-08-11 ENCOUNTER — Encounter: Payer: Self-pay | Admitting: Family Medicine

## 2022-08-11 NOTE — Telephone Encounter (Signed)
Reviewed  Eulis Humble, MD  [facility]

## 2022-08-11 NOTE — Telephone Encounter (Signed)
Called the pharmacy. The pharmacist stated they asked the patient to choose what he prefer and patient didn't know which one. I told the pharmacist to process the one that his insurance covers. They will have it ready in the next hour. I have sent patient a message.

## 2022-08-12 ENCOUNTER — Ambulatory Visit: Payer: Managed Care, Other (non HMO) | Admitting: Family Medicine

## 2022-08-14 ENCOUNTER — Ambulatory Visit
Admission: RE | Admit: 2022-08-14 | Discharge: 2022-08-14 | Disposition: A | Discharge status: Home / Self Care | Payer: Managed Care, Other (non HMO) | Source: Ambulatory Visit | Attending: Family Medicine | Admitting: Family Medicine

## 2022-08-14 DIAGNOSIS — R1012 Left upper quadrant pain: Secondary | ICD-10-CM | POA: Insufficient documentation

## 2022-08-17 ENCOUNTER — Encounter: Payer: Self-pay | Admitting: Emergency Medicine

## 2022-08-17 ENCOUNTER — Emergency Department: Payer: Managed Care, Other (non HMO)

## 2022-08-17 ENCOUNTER — Emergency Department
Admission: EM | Admit: 2022-08-17 | Discharge: 2022-08-17 | Disposition: A | Discharge status: Home / Self Care | Payer: Managed Care, Other (non HMO) | Attending: Emergency Medicine | Admitting: Emergency Medicine

## 2022-08-17 ENCOUNTER — Other Ambulatory Visit: Payer: Self-pay

## 2022-08-17 DIAGNOSIS — X58XXXA Exposure to other specified factors, initial encounter: Secondary | ICD-10-CM | POA: Diagnosis not present

## 2022-08-17 DIAGNOSIS — E109 Type 1 diabetes mellitus without complications: Secondary | ICD-10-CM | POA: Insufficient documentation

## 2022-08-17 DIAGNOSIS — S29011A Strain of muscle and tendon of front wall of thorax, initial encounter: Secondary | ICD-10-CM | POA: Insufficient documentation

## 2022-08-17 DIAGNOSIS — S299XXA Unspecified injury of thorax, initial encounter: Secondary | ICD-10-CM | POA: Diagnosis present

## 2022-08-17 DIAGNOSIS — Z20822 Contact with and (suspected) exposure to covid-19: Secondary | ICD-10-CM | POA: Diagnosis not present

## 2022-08-17 DIAGNOSIS — R0789 Other chest pain: Secondary | ICD-10-CM

## 2022-08-17 LAB — BASIC METABOLIC PANEL WITH GFR
Anion gap: 8 (ref 5–15)
BUN: 16 mg/dL (ref 6–20)
CO2: 25 mmol/L (ref 22–32)
Calcium: 9.4 mg/dL (ref 8.9–10.3)
Chloride: 101 mmol/L (ref 98–111)
Creatinine, Ser: 0.61 mg/dL (ref 0.61–1.24)
GFR, Estimated: >60 mL/min
Glucose, Bld: 242 mg/dL — ABNORMAL HIGH (ref 70–99)
Potassium: 3.6 mmol/L (ref 3.5–5.1)
Sodium: 134 mmol/L — ABNORMAL LOW (ref 135–145)

## 2022-08-17 LAB — CBC
HCT: 41.2 % (ref 39.0–52.0)
Hemoglobin: 14.1 g/dL (ref 13.0–17.0)
MCH: 26.9 pg (ref 26.0–34.0)
MCHC: 34.2 g/dL (ref 30.0–36.0)
MCV: 78.5 fL — ABNORMAL LOW (ref 80.0–100.0)
Platelets: 287 10*3/uL (ref 150–400)
RBC: 5.25 MIL/uL (ref 4.22–5.81)
RDW: 13.2 % (ref 11.5–15.5)
WBC: 6.2 10*3/uL (ref 4.0–10.5)
nRBC: 0 % (ref 0.0–0.2)

## 2022-08-17 LAB — RESP PANEL BY RT-PCR (RSV, FLU A&B, COVID)  RVPGX2
Influenza A by PCR: NEGATIVE
Influenza B by PCR: NEGATIVE
Resp Syncytial Virus by PCR: NEGATIVE
SARS Coronavirus 2 by RT PCR: NEGATIVE

## 2022-08-17 LAB — TROPONIN I (HIGH SENSITIVITY): Troponin I (High Sensitivity): 4 ng/L

## 2022-08-17 MED ORDER — KETOROLAC TROMETHAMINE 15 MG/ML IJ SOLN
15.0000 mg | Freq: Once | INTRAMUSCULAR | Status: AC
  Administered 2022-08-17: 15 mg via INTRAVENOUS
  Filled 2022-08-17: qty 1

## 2022-08-17 NOTE — ED Triage Notes (Signed)
FIRST NURSE NOTE:  Pt with reports of chest pain, pt ambulatory without difficulty.

## 2022-08-17 NOTE — ED Provider Notes (Addendum)
[facility] Provider Note    Event Date/Time   First MD Initiated Contact with Patient 08/17/22 [phone]     (approximate)   History   Chief Complaint: Chest Pain   HPI  Aaron Parker is a 22 y.o. male with a history of GERD, WPW, and insulin-dependent diabetes who comes ED complaining of left-sided chest pain started last night around 10 PM, radiates to his back.  No shortness of breath diaphoresis or vomiting.  Started gradually after eating ice cream.  He has had pain like this in the past.  Not exertional, not pleuritic.  No palpitations dizziness or syncope.  Pain is worse with turning the body and movement.  He works as a Architectural technologist.  He also reports nasal congestion, nonproductive cough, chills, fatigue since yesterday.     Physical Exam   Triage Vital Signs: ED Triage Vitals  Enc Vitals Group     BP 08/17/22 0634 (!) 143/94     Pulse Rate 08/17/22 0634 87     Resp 08/17/22 0634 18     Temp 08/17/22 0632 97.7 F (36.5 C)     Temp Source 08/17/22 0632 Oral     SpO2 08/17/22 0634 98 %     Weight 08/17/22 0632 219 lb (99.3 kg)     Height 08/17/22 0632 '5\' 10"'$  (1.778 m)     Head Circumference --      Peak Flow --      Pain Score 08/17/22 0632 8     Pain Loc --      Pain Edu? --      Excl. in [place]? --     Most recent vital signs: Vitals:   08/17/22 0632 08/17/22 0634  BP:  (!) 143/94  Pulse:  87  Resp:  18  Temp: 97.7 F (36.5 C)   SpO2:  98%    General: Awake, no distress.  CV:  Good peripheral perfusion.  Regular rate and rhythm.  No murmurs Resp:  Normal effort.  Clear to auscultation bilaterally Abd:  No distention.  Soft nontender Other:  Normal peripheral pulses.  No oropharyngeal erythema.  There is nasal congestion.  Left chest wall tender to the touch of the pectoralis major.   ED Results / Procedures / Treatments   Labs (all labs ordered are listed, but only abnormal results are displayed) Labs Reviewed   BASIC METABOLIC PANEL - Abnormal; Notable for the following components:      Result Value   Sodium 134 (*)    Glucose, Bld 242 (*)    All other components within normal limits  CBC - Abnormal; Notable for the following components:   MCV 78.5 (*)    All other components within normal limits  RESP PANEL BY RT-PCR (RSV, FLU A&B, COVID)  [id number]  TROPONIN I (HIGH SENSITIVITY)  TROPONIN I (HIGH SENSITIVITY)     EKG Interpreted by me Sinus rhythm rate of 85.  Normal axis and intervals.  WPW/ventricular preexcitation seen in anterior leads.  Normal ST segments and T waves.  Compared to prior EKG on July 09, 2021, no significant changes.   RADIOLOGY Chest x-ray interpreted by me, appears normal.  Radiology report reviewed   PROCEDURES:  Procedures   MEDICATIONS ORDERED IN ED: Medications  ketorolac (TORADOL) 15 MG/ML injection 15 mg (15 mg Intravenous Given 08/17/22 0726)     IMPRESSION / MDM / ASSESSMENT AND PLAN / ED COURSE  I reviewed the triage vital signs and  the nursing notes.  DDx: Pneumothorax, pneumonia, pleural effusion, non-STEMI, GERD, electrolyte abnormality, esophageal spasm, COVID/flu  Patient's presentation is most consistent with acute presentation with potential threat to life or bodily function.  Patient complains of atypical chest pain, noncardiac.  EKG is unchanged.  High-sensitivity troponin is normal after more than 8 hours of continuous pain.  Chest x-ray is unremarkable. Considering the patient's symptoms, medical history, and physical examination today, I have low suspicion for ACS, PE, TAD, pneumothorax, carditis, mediastinitis, pneumonia, CHF, or sepsis.   ----------------------------------------- 7:58 AM on 08/17/2022 ----------------------------------------- Labs normal.  Feeling better.   ----------------------------------------- 8:34 AM on 08/17/2022 ----------------------------------------- COVID and flu test negative.  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  Ice therapy, continue NSAIDs.  Stable for discharge      FINAL CLINICAL IMPRESSION(S) / ED DIAGNOSES   Final diagnoses:  Atypical chest pain  Muscle strain of chest wall, initial encounter     Rx / DC Orders   ED Discharge Orders     None        Note:  This document was prepared using Dragon voice recognition software and may include unintentional dictation errors.   Carrie Mew, MD 08/17/22 0745    Carrie Mew, MD 08/17/22 [phone]

## 2022-08-17 NOTE — ED Triage Notes (Signed)
Pt to ED via POV, c/o L sided CP that radiates to his back. States Pain 7-8/10. 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 Pt states pain started last night at approx 10pm after eating oreo ice cream.

## 2022-08-28 LAB — SPECIMEN STATUS REPORT

## 2022-08-28 LAB — HGB A1C W/O EAG: Hgb A1c MFr Bld: 11.4 % — ABNORMAL HIGH (ref 4.8–5.6)

## 2022-08-29 NOTE — Progress Notes (Deleted)
Established patient visit   Patient: Aaron Parker   DOB: [birth year]   22 y.o. Male  MRN: [id number] Visit Date: 09/02/2022  Today's healthcare provider: Eulis George, MD   No chief complaint on file.  Subjective    HPI  Diabetes Mellitus Type II, Follow-up  Lab Results  Component Value Date   HGBA1C 11.4 (H) 08/04/2022   HGBA1C 6.1 (H) 07/31/2018   Wt Readings from Last 3 Encounters:  08/17/22 219 lb (99.3 kg)  08/07/22 219 lb 3.2 oz (99.4 kg)  08/04/22 219 lb (99.3 kg)   Last seen for diabetes 2 weeks ago.  Management since then includes start insulin, lantus 12 units at bed time nightly  Will also start metformin XR 750mg  daily. He reports {excellent/good/fair/poor:19665} compliance with treatment. He {is/is not:21021397} having side effects. {document side effects if present:1} Symptoms: {Yes/No:20286} fatigue {Yes/No:20286} foot ulcerations  {Yes/No:20286} appetite changes {Yes/No:20286} nausea  {Yes/No:20286} paresthesia of the feet  {Yes/No:20286} polydipsia  {Yes/No:20286} polyuria {Yes/No:20286} visual disturbances   {Yes/No:20286} vomiting     Home blood sugar records: {diabetes glucometry results:16657}  Episodes of hypoglycemia? {Yes/No:20286} {enter symptoms and frequency of symptoms if yes:1}   Current insulin regiment: Lantus 12 units at bed time   Pertinent Labs: No results found for: "CHOL", "HDL", "LDLCALC", "LDLDIRECT", "TRIG", "CHOLHDL" Lab Results  Component Value Date   NA 134 (L) 08/17/2022   K 3.6 08/17/2022   CREATININE 0.61 08/17/2022   GFRNONAA >60 08/17/2022     ---------------------------------------------------------------------------------------------------   Medications: Outpatient Medications Prior to Visit  Medication Sig   Blood Glucose Monitoring Suppl DEVI 1 each by Does not apply route in the morning, at noon, and at bedtime. May substitute to any manufacturer covered by patient's insurance.    Glucose Blood (BLOOD GLUCOSE TEST STRIPS) STRP 1 each by In Vitro route in the morning and at bedtime. May substitute to any manufacturer covered by patient's insurance.   HYDROXYZINE HCL PO Take 25 mg by mouth at bedtime as needed.   ibuprofen (ADVIL) 200 MG tablet Take 400 mg by mouth every 6 (six) hours as needed for moderate pain.   insulin glargine (LANTUS) 100 UNIT/ML Solostar Pen Inject 12 Units into the skin daily.   Insulin Pen Needle 31G X 8 MM MISC 1 Needle by Does not apply route in the morning and at bedtime.   Lancet Device MISC Use to test blood glucose while fasting in the morning and 2 hours largest meal of the day   Lancets Misc. MISC Use to test blood glucose while fasting in the morning and 2 hours largest meal of the day   lidocaine (XYLOCAINE) 2 % solution Use as directed 15 mLs in the mouth or throat every 3 (three) hours as needed (Throat pain).   metFORMIN (GLUCOPHAGE-XR) 750 MG 24 hr tablet Take 1 tablet (750 mg total) by mouth daily with breakfast.   No facility-administered medications prior to visit.    Review of Systems  {Labs  Heme  Chem  Endocrine  Serology  Results Review (optional):23779}   Objective    There were no vitals taken for this visit. {Show previous vital signs (optional):23777}  Physical Exam  ***  No results found for any visits on 09/02/22.  Assessment & Plan     ***  No follow-ups on file.      {provider attestation***:1}   Eulis Risdon, MD  [facility] [phone] (phone) [phone] (fax)  [facility]

## 2022-09-02 ENCOUNTER — Ambulatory Visit: Payer: Managed Care, Other (non HMO) | Admitting: Family Medicine

## 2022-09-04 ENCOUNTER — Other Ambulatory Visit: Payer: Self-pay | Admitting: Family Medicine

## 2022-09-04 NOTE — Telephone Encounter (Signed)
Requested medication (s) are due for refill today:   Not sure  Requested medication (s) are on the active medication list:   Yes  Future visit scheduled:   No   Last ordered: 08/07/2022 1 each, 0 refills  Returned because pt is newly diagnosed with diabetes.   Not sure whether she needs the whole kit with meter and all or if this was given to her on 2/29.    Provider to review.      Requested Prescriptions  Pending Prescriptions Disp Refills   Blood Glucose Monitoring Suppl (ONE TOUCH ULTRA 2) w/Device KIT [Pharmacy Med Name: ONETOUCH ULTRA 2   KIT]  0    Sig: USE TO TEST BLOOD SUGAR IN THE MORNING, AT NOON, AND AT BEDTIME     Endocrinology: Diabetes - Testing Supplies Passed - 09/04/2022  9:22 AM      Passed - Valid encounter within last 12 months    Recent Outpatient Visits           4 weeks ago Hyperglycemia due to diabetes mellitus ([place])   [place] [facility] Simmons-Robinson, [place], MD   1 month ago LUQ abdominal pain   [place] [facility] [place], Riki Sheer, MD

## 2022-09-15 NOTE — Progress Notes (Unsigned)
I,Aaron Parker,acting as a scribe for [employer], MD.,have documented all relevant documentation on the behalf of [name], MD,as directed by  [name], MD while in the presence of [name], MD.   Established patient visit   Patient: Aaron Parker   DOB: 06-26-2000   22 y.o. Male  MRN: [id number] Visit Date: 09/16/2022  Today's healthcare provider: [name], MD   Chief Complaint  Patient presents with   Follow-up   Subjective    HPI  Diabetes follow-up - Checking BG at home: yes, Fasting 210's-300's. In the PM 300's-400's - Medications:  Lantus 12 units at bed time nightly. Will also start metformin XR 750mg  daily, plan to DC this if it causes severe GI upset again. Reports no issues with Metformin. - Adherence to prescribed regimen: reports regular adherence - denies symptoms of hypoglycemia, endorses polyuria & polydipsia, denies numbness extremities,denies foot ulcers/trauma  -Eye Exam: not UTD Lab Results  Component Value Date   HGBA1C 11.4 (H) 08/04/2022   Patient is requesting endocrinology referral to Dr. Gershon Crane, states that his girlfriend has family members with diabetes who see this physician and he would like to be evaluated as well for his diabetes   Patient continues to eat the same diet including processed foods and sodas, he is agreeable to referral for diabetes education and nutrition services  We discussed recommendation for trulicity however patient is unsure of personal or family hx of medullary thyroid cancer, recommended that he confirm this prior to starting medication   Medications: Outpatient Medications Prior to Visit  Medication Sig   Blood Glucose Monitoring Suppl (ONE TOUCH ULTRA 2) w/Device KIT USE TO TEST BLOOD SUGAR IN THE MORNING, AT NOON, AND AT BEDTIME   insulin glargine (LANTUS) 100 UNIT/ML Solostar Pen Inject 12 Units into the skin daily.   Insulin Pen  Needle 31G X 8 MM MISC 1 Needle by Does not apply route in the morning and at bedtime.   Lancet Device MISC Use to test blood glucose while fasting in the morning and 2 hours largest meal of the day   Lancets Misc. MISC Use to test blood glucose while fasting in the morning and 2 hours largest meal of the day   metFORMIN (GLUCOPHAGE-XR) 750 MG 24 hr tablet Take 1 tablet (750 mg total) by mouth daily with breakfast.   Glucose Blood (BLOOD GLUCOSE TEST STRIPS) STRP 1 each by In Vitro route in the morning and at bedtime. May substitute to any manufacturer covered by patient's insurance.   HYDROXYZINE HCL PO Take 25 mg by mouth at bedtime as needed.   ibuprofen (ADVIL) 200 MG tablet Take 400 mg by mouth every 6 (six) hours as needed for moderate pain.   lidocaine (XYLOCAINE) 2 % solution Use as directed 15 mLs in the mouth or throat every 3 (three) hours as needed (Throat pain).   No facility-administered medications prior to visit.    Review of Systems     Objective    BP 133/88 (BP Location: Right Arm, Patient Position: Sitting, Cuff Size: Normal)   Pulse 89   Temp 97.7 F (36.5 C) (Oral)   Resp 16   Wt 222 lb (100.7 kg)   BMI 31.85 kg/m    Physical Exam Vitals reviewed.  Constitutional:      General: He is not in acute distress.    Appearance: Normal appearance. He is not ill-appearing, toxic-appearing or diaphoretic.  Eyes:     Conjunctiva/sclera: Conjunctivae  normal.  Neck:     Thyroid: No thyroid mass, thyromegaly or thyroid tenderness.     Vascular: No carotid bruit.  Cardiovascular:     Rate and Rhythm: Normal rate and regular rhythm.     Pulses: Normal pulses.     Heart sounds: Normal heart sounds. No murmur heard.    No friction rub. No gallop.  Pulmonary:     Effort: Pulmonary effort is normal. No respiratory distress.     Breath sounds: Normal breath sounds. No stridor. No wheezing, rhonchi or rales.  Abdominal:     General: Bowel sounds are normal. There is no  distension.     Palpations: Abdomen is soft.     Tenderness: There is no abdominal tenderness.  Musculoskeletal:     Right lower leg: No edema.     Left lower leg: No edema.  Lymphadenopathy:     Cervical: No cervical adenopathy.  Skin:    Findings: No erythema or rash.  Neurological:     Mental Status: He is alert and oriented to person, place, and time.     No results found for any visits on 09/16/22.  Assessment & Plan     Problem List Items Addressed This Visit       Endocrine   Type 2 diabetes mellitus with hyperglycemia, with long-term current use of insulin - Primary    Chronic  Uncontrolled, not at goal A1c.  Lab Results  Component Value Date   HGBA1C 11.4 (H) 08/04/2022  Will add trulicity 0.75mg  once weekly once patient has been able to confirm family hx of medullary thyroid cancer Continue lantus 12 units daily  Patient will continue checking fasting and dinner glucose levels, these levels were 200-350 for the last month  Patient requests endocrinology referral to Dr. Verdis Frederickson for diabetes  Continue metforminXR 750mg  daily  Referral submitted for diabetes nutritionist and education       Relevant Orders   Ambulatory referral to Endocrinology   Referral to Nutrition and Diabetes Services     Return in about 1 month (around 10/16/2022) for DM .        The entirety of the information documented in the History of Present Illness, Review of Systems and Physical Exam were personally obtained by me. Portions of this information were initially documented by Hetty Ely, CMA . I, [name], MD have reviewed the documentation above for thoroughness and accuracy.   [name], MD  [facility] [phone] (phone) [phone] (fax)  [facility]

## 2022-09-16 ENCOUNTER — Encounter: Payer: Self-pay | Admitting: Family Medicine

## 2022-09-16 ENCOUNTER — Ambulatory Visit: Payer: Managed Care, Other (non HMO) | Admitting: Family Medicine

## 2022-09-16 VITALS — BP 133/88 | HR 89 | Temp 97.7°F | Resp 16 | Wt 222.0 lb

## 2022-09-16 DIAGNOSIS — E1165 Type 2 diabetes mellitus with hyperglycemia: Secondary | ICD-10-CM

## 2022-09-16 DIAGNOSIS — Z794 Long term (current) use of insulin: Secondary | ICD-10-CM | POA: Diagnosis not present

## 2022-09-16 NOTE — Patient Instructions (Addendum)
It was a pleasure to see you today!  Thank you for choosing [facility] for your primary care.   Aaron Parker was seen for diabetes.   Our plans for today were: Continue the insulin at 12 units daily. If you continue to have elevated glucose measurements greater than 200 fasting, increase your lantus by 1 unit daily.  Ex. Fasting glucose is 250, increase lantus that day to 13, the next day it is 233, increase lantus to 14, etc  Please send a Mychart message to confirm no family history of thyroid cancer and no history of thyroid cancer for you. Once we confirm this, I will send the prescription for Trulicity 0.75mg  once weekly.  Continue the metformin 750mg  daily  I have submitted an endocrinology referral as requested and for diabetes nutritionist/education please be on the lookout for a call to schedule an appointment. Please let us know if you don't hear anything after two weeks.   To keep you healthy, please keep in mind the following health maintenance items that you are due for:   HPV vaccine  Diabetes eye exam   HIV screening  Tetanus vaccine    You should return to our clinic in 1 month for diabetes follow up.   Best Wishes,   Dr. Roxan Hockey

## 2022-09-16 NOTE — Assessment & Plan Note (Addendum)
Chronic  Uncontrolled, not at goal A1c.  Lab Results  Component Value Date   HGBA1C 11.4 (H) 08/04/2022   Will add trulicity 0.75mg  once weekly once patient has been able to confirm family hx of medullary thyroid cancer Continue lantus 12 units daily  Patient will continue checking fasting and dinner glucose levels, these levels were 200-350 for the last month  Patient requests endocrinology referral to Dr. Verdis Frederickson for diabetes  Continue metforminXR 750mg  daily  Referral submitted for diabetes nutritionist and education

## 2022-10-21 NOTE — Progress Notes (Unsigned)
I,Joseline E Rosas,acting as a scribe for [employer], MD.,have documented all relevant documentation on the behalf of [name], MD,as directed by  [name], MD while in the presence of [name], MD.   Established patient visit   Patient: Aaron Parker   DOB: 2000-10-08   22 y.o. Male  MRN: [id number] Visit Date: 10/22/2022  Today's healthcare provider: [name], MD   Chief Complaint  Patient presents with   Follow-up   Subjective    HPI  Diabetes Mellitus Type II, Follow-up  Lab Results  Component Value Date   HGBA1C 11.4 (H) 08/04/2022   HGBA1C 6.1 (H) 07/31/2018   Wt Readings from Last 3 Encounters:  10/22/22 230 lb 3.2 oz (104.4 kg)  09/16/22 222 lb (100.7 kg)  08/17/22 219 lb (99.3 kg)   Last seen for diabetes 1 months ago.  Management since then includes add trulicity 0.75mg  once weekly once patient has been able to confirm family hx of medullary thyroid cancer Continue lantus 12 units daily,Continue metforminXR 750mg  daily. Patient requested endocrinology referral to Dr. Verdis Frederickson for diabetes, physician is not accepting new patients so patient will contact referral coordinator for other provider options, endocrinology appt can then be scheduled.  Referral submitted for diabetes nutritionist and education, patient states he has not scheduled with the nutritionist yet    Symptoms: No fatigue No foot ulcerations  No appetite changes No nausea  No paresthesia of the feet  No polydipsia  Yes polyuria No visual disturbances   No vomiting     Home blood sugar records: fasting range: not being checked. Checked this morning and it was 216  Episodes of hypoglycemia? No    Current insulin regiment:12 units insulin   Most Recent Eye Exam: waiting on insurance  Pertinent Labs: No results found for: "CHOL", "HDL", "LDLCALC", "LDLDIRECT", "TRIG", "CHOLHDL" Lab Results  Component  Value Date   NA 134 (L) 08/17/2022   K 3.6 08/17/2022   CREATININE 0.61 08/17/2022   GFRNONAA >60 08/17/2022     ---------------------------------------------------------------------------------------------------   Intermittent Abdominal Pain  Patient reports frequent abdominal pain  Reports feeling like his abdomen was sucked in and he was seen in the UC due to severe pain  Reports he can not tolerated pressure on his abdomen as all  He denies melena, denies hematochezia  Denies Nausea and vomiting  He has been having issues with his abdomen for over a year (prior to starting metformin)  He reports having symptoms after eating chinese food and burgers  The severe pain was localized to his central lower abdomen  Reports that he current feels sore in his lower abdomen    Medications: Outpatient Medications Prior to Visit  Medication Sig   Blood Glucose Monitoring Suppl (ONE TOUCH ULTRA 2) w/Device KIT USE TO TEST BLOOD SUGAR IN THE MORNING, AT NOON, AND AT BEDTIME   Insulin Pen Needle 31G X 8 MM MISC 1 Needle by Does not apply route in the morning and at bedtime.   Lancet Device MISC Use to test blood glucose while fasting in the morning and 2 hours largest meal of the day   Lancets Misc. MISC Use to test blood glucose while fasting in the morning and 2 hours largest meal of the day   metFORMIN (GLUCOPHAGE-XR) 750 MG 24 hr tablet Take 1 tablet (750 mg total) by mouth daily with breakfast.   [DISCONTINUED] insulin glargine (LANTUS) 100 UNIT/ML Solostar Pen Inject 12 Units into the skin  daily.   Glucose Blood (BLOOD GLUCOSE TEST STRIPS) STRP 1 each by In Vitro route in the morning and at bedtime. May substitute to any manufacturer covered by patient's insurance.   No facility-administered medications prior to visit.    Review of Systems     Objective    BP 127/86 (BP Location: Left Arm, Patient Position: Sitting, Cuff Size: Normal)   Pulse 97   Temp 98.6 F (37 C) (Oral)    Resp 16   Wt 230 lb 3.2 oz (104.4 kg)   BMI 33.03 kg/m    Physical Exam Vitals reviewed.  Constitutional:      General: He is not in acute distress.    Appearance: Normal appearance. He is not ill-appearing, toxic-appearing or diaphoretic.  Eyes:     Conjunctiva/sclera: Conjunctivae normal.  Cardiovascular:     Rate and Rhythm: Normal rate and regular rhythm.     Pulses: Normal pulses.     Heart sounds: Normal heart sounds. No murmur heard.    No friction rub. No gallop.  Pulmonary:     Effort: Pulmonary effort is normal. No respiratory distress.     Breath sounds: Normal breath sounds. No stridor. No wheezing, rhonchi or rales.  Abdominal:     General: Bowel sounds are normal. There is no distension.     Palpations: Abdomen is soft.     Tenderness: There is abdominal tenderness in the suprapubic area.  Musculoskeletal:     Right lower leg: No edema.     Left lower leg: No edema.  Skin:    Findings: No erythema or rash.  Neurological:     Mental Status: He is alert and oriented to person, place, and time.       No results found for any visits on 10/22/22.  Assessment & Plan     Problem List Items Addressed This Visit       Endocrine   Type 2 diabetes mellitus with hyperglycemia, with long-term current use of insulin (HCC) - Primary    Chronic  Not at goal  Will start ozempic 0.25mg  once weekly  Start lisinopril 2.5 mg daily for renal protective measures, counseled patient and his significant other to stop medication if he has lip swelling, difficulty breathing or persistent dry cough and to notify office if the symptoms develop Recommended that patient have diabetes eye exam Will complete foot exam at next appointment Increased insulin to 25 units daily, refills provided  Patient reports that fasting BG of 200+ Patient still wishes to be evaluated by endocrinology, number to schedule appt per previous referral given to patient      Relevant Medications    insulin glargine (LANTUS) 100 UNIT/ML Solostar Pen   Semaglutide,0.25 or 0.5MG /DOS, (OZEMPIC, 0.25 OR 0.5 MG/DOSE,) 2 MG/3ML SOPN   lisinopril (ZESTRIL) 2.5 MG tablet   Other Relevant Orders   Urine microalbumin-creatinine with uACR     Other   Chronic abdominal pain    Chronic Intermittent severe abdominal pain that patient localizes to suprapubic region Patient denies urinary symptoms when he has these abdominal pains Patient requesting referral to gastroenterology for evaluation Gastroenterology referral submitted today       Relevant Orders   Ambulatory referral to Gastroenterology     Return in about 1 month (around 11/22/2022) for DM, GI follow up .        The entirety of the information documented in the History of Present Illness, Review of Systems and Physical Exam were personally obtained by  me. Portions of this information were initially documented by Hetty Ely, CMA . I, [name], MD have reviewed the documentation above for thoroughness and accuracy.      [name], MD  [facility] [phone] (phone) [phone] (fax)  [facility]

## 2022-10-22 ENCOUNTER — Encounter: Payer: Self-pay | Admitting: Family Medicine

## 2022-10-22 ENCOUNTER — Ambulatory Visit: Payer: Managed Care, Other (non HMO) | Admitting: Family Medicine

## 2022-10-22 VITALS — BP 127/86 | HR 97 | Temp 98.6°F | Resp 16 | Wt 230.2 lb

## 2022-10-22 DIAGNOSIS — E1165 Type 2 diabetes mellitus with hyperglycemia: Secondary | ICD-10-CM | POA: Diagnosis not present

## 2022-10-22 DIAGNOSIS — Z794 Long term (current) use of insulin: Secondary | ICD-10-CM | POA: Diagnosis not present

## 2022-10-22 DIAGNOSIS — R109 Unspecified abdominal pain: Secondary | ICD-10-CM

## 2022-10-22 DIAGNOSIS — G8929 Other chronic pain: Secondary | ICD-10-CM | POA: Insufficient documentation

## 2022-10-22 MED ORDER — INSULIN GLARGINE 100 UNIT/ML SOLOSTAR PEN: 25.0000 [IU] | PEN_INJECTOR | Freq: Every day | SUBCUTANEOUS | 2 refills | Status: DC

## 2022-10-22 MED ORDER — OZEMPIC (0.25 OR 0.5 MG/DOSE) 2 MG/3ML ~~LOC~~ SOPN: 0.2500 mg | PEN_INJECTOR | SUBCUTANEOUS | 0 refills | Status: DC

## 2022-10-22 MED ORDER — LISINOPRIL 2.5 MG PO TABS: 2.5000 mg | ORAL_TABLET | Freq: Every day | ORAL | 1 refills | Status: DC

## 2022-10-22 NOTE — Assessment & Plan Note (Signed)
Chronic Intermittent severe abdominal pain that patient localizes to suprapubic region Patient denies urinary symptoms when he has these abdominal pains Patient requesting referral to gastroenterology for evaluation Gastroenterology referral submitted today

## 2022-10-22 NOTE — Assessment & Plan Note (Addendum)
Chronic  Not at goal  Will start ozempic 0.25mg  once weekly  Start lisinopril 2.5 mg daily for renal protective measures, counseled patient and his significant other to stop medication if he has lip swelling, difficulty breathing or persistent dry cough and to notify office if the symptoms develop Recommended that patient have diabetes eye exam Will complete foot exam at next appointment Increased insulin to 25 units daily, refills provided  Patient reports that fasting BG of 200+ Patient still wishes to be evaluated by endocrinology, number to schedule appt per previous referral given to patient

## 2022-10-22 NOTE — Patient Instructions (Addendum)
[phone] [facility]    Please schedule a diabetes eye exam   I have placed referral for gastroenterology.   Please start Ozempic 0.25mg  once weekly for diabetes as well as the lisinopril 2.5mg   Continue metformin 750mg  and lantus 25units daily.

## 2022-10-23 LAB — MICROALBUMIN / CREATININE URINE RATIO
Creatinine, Urine: 244.5 mg/dL
Microalb/Creat Ratio: 370 mg/g creat — ABNORMAL HIGH (ref 0–29)
Microalbumin, Urine: 905.1 ug/mL

## 2022-10-24 ENCOUNTER — Telehealth: Payer: Self-pay | Admitting: Family Medicine

## 2022-10-24 NOTE — Telephone Encounter (Signed)
Pt is returning Sara's call. Please advise CB- [phone]

## 2022-10-30 ENCOUNTER — Telehealth: Payer: Self-pay

## 2022-10-30 NOTE — Telephone Encounter (Signed)
Copied from CRM [id number]. Topic: Referral - Status >> Oct 30, 2022  3:30 PM Haroldine Laws wrote: Reason for CRM:  Pt called asking about a referral to Endocrinology  He has not heard anything and talked to Dr. Roxan Hockey last week he said.

## 2022-10-31 ENCOUNTER — Telehealth: Payer: Self-pay | Admitting: Family Medicine

## 2022-10-31 ENCOUNTER — Encounter: Payer: Self-pay | Admitting: Family Medicine

## 2022-10-31 NOTE — Telephone Encounter (Signed)
Medical Records were faxed to [facility] on 10/31/2022. Company is submitting a claim for patient. A medical record release was signed.

## 2022-10-31 NOTE — Telephone Encounter (Signed)
Message sent to patient via mychart

## 2022-11-10 ENCOUNTER — Encounter: Payer: Self-pay | Admitting: Family Medicine

## 2022-11-10 ENCOUNTER — Telehealth: Payer: Self-pay

## 2022-11-10 NOTE — Telephone Encounter (Signed)
Copied from CRM [id number]. Topic: General - Other >> Nov 10, 2022 11:56 AM Dondra Prader A wrote: Reason for CRM: Please view pt message from today. Pt is calling in to make sure his PCP gets his message regarding his leave of absence paper work for his disability case. Pt states that they sent the paper work to his PCP last Thursday and he knows that his PCP was out of the office but would like to see if she can fill out the paper work and send it back as soon as possible. I did let pt know that his message was sent to his PCP this morning.

## 2022-11-12 NOTE — Telephone Encounter (Signed)
Forms were faxed and pt was notified on previous mychart message about this.

## 2022-11-24 ENCOUNTER — Ambulatory Visit: Payer: Managed Care, Other (non HMO) | Admitting: Family Medicine

## 2022-11-24 ENCOUNTER — Ambulatory Visit: Payer: Managed Care, Other (non HMO) | Admitting: Physician Assistant

## 2022-11-24 ENCOUNTER — Encounter: Payer: Self-pay | Admitting: Family Medicine

## 2022-11-24 ENCOUNTER — Encounter: Payer: Self-pay | Admitting: Physician Assistant

## 2022-11-24 VITALS — BP 135/92 | HR 106 | Temp 97.7°F | Ht 70.0 in | Wt 225.2 lb

## 2022-11-24 VITALS — BP 137/94 | HR 101 | Ht 70.0 in | Wt 226.3 lb

## 2022-11-24 DIAGNOSIS — Z794 Long term (current) use of insulin: Secondary | ICD-10-CM

## 2022-11-24 DIAGNOSIS — K219 Gastro-esophageal reflux disease without esophagitis: Secondary | ICD-10-CM | POA: Diagnosis not present

## 2022-11-24 DIAGNOSIS — R1084 Generalized abdominal pain: Secondary | ICD-10-CM

## 2022-11-24 DIAGNOSIS — R7989 Other specified abnormal findings of blood chemistry: Secondary | ICD-10-CM | POA: Diagnosis not present

## 2022-11-24 DIAGNOSIS — E1165 Type 2 diabetes mellitus with hyperglycemia: Secondary | ICD-10-CM

## 2022-11-24 DIAGNOSIS — R197 Diarrhea, unspecified: Secondary | ICD-10-CM

## 2022-11-24 DIAGNOSIS — K760 Fatty (change of) liver, not elsewhere classified: Secondary | ICD-10-CM | POA: Diagnosis not present

## 2022-11-24 DIAGNOSIS — I152 Hypertension secondary to endocrine disorders: Secondary | ICD-10-CM | POA: Insufficient documentation

## 2022-11-24 DIAGNOSIS — R030 Elevated blood-pressure reading, without diagnosis of hypertension: Secondary | ICD-10-CM | POA: Diagnosis not present

## 2022-11-24 DIAGNOSIS — R110 Nausea: Secondary | ICD-10-CM

## 2022-11-24 LAB — POCT GLYCOSYLATED HEMOGLOBIN (HGB A1C): Hemoglobin A1C: 8.8 % — AB (ref 4.0–5.6)

## 2022-11-24 MED ORDER — OMEPRAZOLE 20 MG PO CPDR: 20.0000 mg | DELAYED_RELEASE_CAPSULE | Freq: Every day | ORAL | 3 refills | Status: DC

## 2022-11-24 MED ORDER — INSULIN GLARGINE 100 UNIT/ML SOLOSTAR PEN: 40.0000 [IU] | PEN_INJECTOR | Freq: Every day | SUBCUTANEOUS | 2 refills | Status: DC

## 2022-11-24 MED ORDER — DICYCLOMINE HCL 10 MG PO CAPS: 10.0000 mg | ORAL_CAPSULE | Freq: Three times a day (TID) | ORAL | 2 refills | Status: DC

## 2022-11-24 MED ORDER — LISINOPRIL 5 MG PO TABS: 5.0000 mg | ORAL_TABLET | Freq: Every day | ORAL | 1 refills | Status: DC

## 2022-11-24 MED ORDER — OZEMPIC (0.25 OR 0.5 MG/DOSE) 2 MG/3ML ~~LOC~~ SOPN: 0.5000 mg | PEN_INJECTOR | SUBCUTANEOUS | 1 refills | Status: DC

## 2022-11-24 NOTE — Progress Notes (Signed)
Celso Amy, PA-C [address]  [address]  [place], [place] [address]  Main: [phone]  Fax: [phone]   Gastroenterology Consultation  Referring Provider:     Brett Albino* Primary Care Physician:  [name], MD Primary Gastroenterologist:  Celso Amy, PA-C / Dr. Wyline Mood  Reason for Consultation:     Chronic abdominal pain        HPI:   Aaron Parker is a 22 y.o. y/o male referred for consultation & management  by [name], MD.    Had chronic abdominal pain for many years.  Abdominal pain has been located in the left upper quadrant and generalized mid abdomen diffusely.  His abdomen hurts even with light pressure on his anterior abdomen.  He has irregular bowel habits with diarrhea alternating with constipation.  He feels like his GI symptoms have been getting worse in the past few months.  He has nausea, abdominal bloating, and heartburn.  He denies vomiting or dysphagia.  Admits to weight loss since he started a low sugar diabetic diet.  Family history unknown.  No previous GI evaluation, EGD, or colonoscopy.  He has not had any abdominal or pelvic surgeries.  Complete abdominal ultrasound 08/2022, to evaluate LUQ pain showed no acute abnormality.  No gallstones.  Incidental hepatic steatosis.  Prominent spleen 11.3 cm.  CT abdomen pelvis with contrast 12/2019, to evaluate LUQ pain, showed hepatic steatosis, otherwise no abnormality.  No bowel inflammation.  Labs 10/20/2022 showed elevated glucose 191, elevated AST 47, ALT 69.  Normal CBC with hemoglobin 15.9.  Recently diagnosed with diabetes.  Recently started on Ozempic and metformin which has increased his GI symptoms.  Past Medical History:  Diagnosis Date   Chronic abdominal pain    Diabetes (HCC)    Eczema    GERD (gastroesophageal reflux disease)    Muscle strain    Wolff-Parkinson-White (WPW) syndrome     Past Surgical History:  Procedure Laterality  Date   ADENOIDECTOMY  2007   TONSILLECTOMY      Prior to Admission medications   Medication Sig Start Date End Date Taking? Authorizing Provider  Blood Glucose Monitoring Suppl (ONE TOUCH ULTRA 2) w/Device KIT USE TO TEST BLOOD SUGAR IN THE MORNING, AT NOON, AND AT BEDTIME 09/04/22   Simmons-Robinson, Makiera, MD  Glucose Blood (BLOOD GLUCOSE TEST STRIPS) STRP 1 each by In Vitro route in the morning and at bedtime. May substitute to any manufacturer covered by patient's insurance. 08/07/22 09/06/22  Simmons-Robinson, Makiera, MD  insulin glargine (LANTUS) 100 UNIT/ML Solostar Pen Inject 40 Units into the skin daily. 11/24/22   Simmons-Robinson, Makiera, MD  Insulin Pen Needle 31G X 8 MM MISC 1 Needle by Does not apply route in the morning and at bedtime. 08/07/22   Simmons-Robinson, Tawanna Cooler, MD  Lancet Device MISC Use to test blood glucose while fasting in the morning and 2 hours largest meal of the day 08/07/22   Simmons-Robinson, Tawanna Cooler, MD  Lancets Misc. MISC Use to test blood glucose while fasting in the morning and 2 hours largest meal of the day 08/07/22   Simmons-Robinson, Makiera, MD  lisinopril (ZESTRIL) 5 MG tablet Take 1 tablet (5 mg total) by mouth daily. 11/24/22   Simmons-Robinson, Tawanna Cooler, MD  metFORMIN (GLUCOPHAGE-XR) 750 MG 24 hr tablet Take 1 tablet (750 mg total) by mouth daily with breakfast. 08/07/22   Simmons-Robinson, Makiera, MD  Semaglutide,0.25 or 0.5MG /DOS, (OZEMPIC, 0.25 OR 0.5 MG/DOSE,) 2 MG/3ML SOPN Inject 0.5 mg into the  skin once a week. 11/24/22   Simmons-Robinson, Tawanna Cooler, MD    History reviewed. No pertinent family history.   Social History   Tobacco Use   Smoking status: Never    Passive exposure: Yes   Smokeless tobacco: Never  Vaping Use   Vaping Use: Never used  Substance Use Topics   Alcohol use: Yes    Allergies as of 11/24/2022 - Review Complete 11/24/2022  Allergen Reaction Noted   Codeine Hives 09/08/2015    Review of Systems:    All systems  reviewed and negative except where noted in HPI.   Physical Exam:  BP (!) 135/92   Pulse (!) 106   Temp 97.7 F (36.5 C)   Ht 5\' 10"  (1.778 m)   Wt 225 lb 3.2 oz (102.2 kg)   BMI 32.31 kg/m  No LMP for male patient. Psych:  Alert and cooperative. Normal mood and affect. General:   Alert,  Well-developed, well-nourished, pleasant and cooperative in NAD Head:  Normocephalic and atraumatic. Eyes:  Sclera clear, no icterus.   Conjunctiva pink. Neck:  Supple; no masses or thyromegaly. Lungs:  Respirations even and unlabored.  Clear throughout to auscultation.   No wheezes, crackles, or rhonchi. No acute distress. Heart:  Regular rate and rhythm; no murmurs, clicks, rubs, or gallops. Abdomen:  Normal bowel sounds.  No bruits.  Soft, and non-distended without masses, hepatosplenomegaly or hernias noted.  No Tenderness.  No guarding or rebound tenderness.    Neurologic:  Alert and oriented x3;  grossly normal neurologically. Psych:  Alert and cooperative. Normal mood and affect.  Imaging Studies: No results found.  Assessment and Plan:   Jordaan Curet Parker is a 22 y.o. y/o male has been referred for   1.  Chronic abdominal pain, LUQ and generalized 2.  GERD 3.  Nausea 4.  Irregular bowel habits with Diarrhea 5.  Elevated liver transaminases 6.  Fatty liver with elevated liver transaminases   Plan 1.  Abdominal pelvic CT with contrast 2.  Labs: Hepatic panel, Lipase, BMP, CBC, Celiac, H. Pylori  3.  Low-fat diet, regular exercise, and weight loss for fatty liver. 4.  Continue to follow-up with PCP for treatment of diabetes 5.  After he completes H. pylori breath test, then I recommend he start PPI omeprazole 20 Mg once daily. 6.  Start dicyclomine 10 Mg 3 times daily as needed abdominal cramping/IBS.  Follow up in 4 weeks with TG.  Celso Amy, PA-C

## 2022-11-24 NOTE — Progress Notes (Signed)
I,Aaron Parker,acting as a Neurosurgeon for [employer], MD.,have documented all relevant documentation on the behalf of Aaron Ramp, MD,as directed by  Aaron Ramp, MD while in the presence of Aaron Ramp, MD.   Established patient visit   Patient: Aaron Parker   DOB: 04-Sep-2000   22 y.o. Male  MRN: [id number] Visit Date: 11/24/2022  Today's healthcare provider: [name], MD   No chief complaint on file.  Subjective    HPI   Diabetes Mellitus Type II, Follow-up  Lab Results  Component Value Date   HGBA1C 8.8 (A) 11/24/2022   HGBA1C 11.4 (H) 08/04/2022   HGBA1C 6.1 (H) 07/31/2018   Wt Readings from Last 3 Encounters:  11/24/22 226 lb 4.8 oz (102.6 kg)  10/22/22 230 lb 3.2 oz (104.4 kg)  09/16/22 222 lb (100.7 kg)   Last seen for diabetes 1 months ago.  Management since then includes continue metformin 750mg  daily, lantus was increased to 25mg  daily, started ozempic 0.25mg  weekly and patient was started on lisinopril 2.5mg  daily. He reports excellent compliance with treatment. He is not having side effects. Reports experiencing some decreased fatigue   Symptoms:  No fatigue No foot ulcerations  Yes appetite changes Yes nausea  No paresthesia of the feet  No polydipsia  No polyuria Yes visual disturbances   No vomiting     Home blood sugar records: fasting range: 198-280  Episodes of hypoglycemia? No    Current insulin regiment:Lantus 35units daily Most Recent Eye Exam: trying to get scheduled through job insurance Current exercise: walking and weightlifting   Pertinent Labs: No results found for: "CHOL", "HDL", "LDLCALC", "LDLDIRECT", "TRIG", "CHOLHDL" Lab Results  Component Value Date   NA 134 (L) 08/17/2022   K 3.6 08/17/2022   CREATININE 0.61 08/17/2022   GFRNONAA >60 08/17/2022   LABMICR 905.1 10/22/2022   MICRALBCREAT 370 (H) 10/22/2022     --------------------------------------------------------------------------------------------------- HTN  BP elevated on two measurements today  Denies HA  Reports some changes with visual acuity, states he has not seen eye doctor in while   Medications: Outpatient Medications Prior to Visit  Medication Sig   Blood Glucose Monitoring Suppl (ONE TOUCH ULTRA 2) w/Device KIT USE TO TEST BLOOD SUGAR IN THE MORNING, AT NOON, AND AT BEDTIME   Insulin Pen Needle 31G X 8 MM MISC 1 Needle by Does not apply route in the morning and at bedtime.   Lancet Device MISC Use to test blood glucose while fasting in the morning and 2 hours largest meal of the day   Lancets Misc. MISC Use to test blood glucose while fasting in the morning and 2 hours largest meal of the day   metFORMIN (GLUCOPHAGE-XR) 750 MG 24 hr tablet Take 1 tablet (750 mg total) by mouth daily with breakfast.   [DISCONTINUED] insulin glargine (LANTUS) 100 UNIT/ML Solostar Pen Inject 25 Units into the skin daily.   [DISCONTINUED] lisinopril (ZESTRIL) 2.5 MG tablet Take 1 tablet (2.5 mg total) by mouth daily.   [DISCONTINUED] Semaglutide,0.25 or 0.5MG /DOS, (OZEMPIC, 0.25 OR 0.5 MG/DOSE,) 2 MG/3ML SOPN Inject 0.25 mg into the skin once a week.   Glucose Blood (BLOOD GLUCOSE TEST STRIPS) STRP 1 each by In Vitro route in the morning and at bedtime. May substitute to any manufacturer covered by patient's insurance.   No facility-administered medications prior to visit.    Review of Systems     Objective    BP (!) 137/94 (BP Location: Left Arm, Patient Position: Sitting,  Cuff Size: Normal)   Pulse (!) 101   Ht 5\' 10"  (1.778 m)   Wt 226 lb 4.8 oz (102.6 kg)   SpO2 100%   BMI 32.47 kg/m    Physical Exam Vitals reviewed.  Constitutional:      General: He is not in acute distress.    Appearance: Normal appearance. He is not ill-appearing, toxic-appearing or diaphoretic.  Eyes:     Conjunctiva/sclera: Conjunctivae normal.   Cardiovascular:     Rate and Rhythm: Normal rate and regular rhythm.     Pulses: Normal pulses.          Dorsalis pedis pulses are 2+ on the right side and 2+ on the left side.       Posterior tibial pulses are 2+ on the right side and 2+ on the left side.     Heart sounds: Normal heart sounds. No murmur heard.    No friction rub. No gallop.  Pulmonary:     Effort: Pulmonary effort is normal. No respiratory distress.     Breath sounds: Normal breath sounds. No stridor. No wheezing, rhonchi or rales.  Abdominal:     General: Bowel sounds are normal. There is no distension.     Palpations: Abdomen is soft.     Tenderness: There is abdominal tenderness.  Musculoskeletal:     Right lower leg: No edema.     Left lower leg: No edema.     Right foot: Normal range of motion. No deformity or bunion.     Left foot: Normal range of motion. No deformity or bunion.       Feet:  Feet:     Right foot:     Protective Sensation: 6 sites tested.  6 sites sensed.     Skin integrity: Skin integrity normal. No erythema.     Toenail Condition: Right toenails are normal.     Left foot:     Protective Sensation: 6 sites tested.  6 sites sensed.     Skin integrity: Skin integrity normal. No erythema.     Toenail Condition: Left toenails are normal.  Skin:    Findings: No erythema or rash.  Neurological:     Mental Status: He is alert and oriented to person, place, and time.       Results for orders placed or performed in visit on 11/24/22  POCT HgB A1C  Result Value Ref Range   Hemoglobin A1C 8.8 (A) 4.0 - 5.6 %   HbA1c POC (<> result, manual entry)     HbA1c, POC (prediabetic range)     HbA1c, POC (controlled diabetic range)      Assessment & Plan     Problem List Items Addressed This Visit       Endocrine   Type 2 diabetes mellitus with hyperglycemia, with long-term current use of insulin (HCC) - Primary    Chronic  Not yet at goal  Repeated A1c today, improved from 11 to 8.8   Increased lantus to 40 units daily  Recommended reaching out to referral coordinator for follow up with endocrinology referral  Increased ozempic to 0.5mg  weekly  Continue lisinopril, increased to 5mg  daily from 2.5mg   Patient will schedule eye exam with insurance designated provider  Foot exam completed today         Relevant Medications   insulin glargine (LANTUS) 100 UNIT/ML Solostar Pen   Semaglutide,0.25 or 0.5MG /DOS, (OZEMPIC, 0.25 OR 0.5 MG/DOSE,) 2 MG/3ML SOPN   lisinopril (ZESTRIL) 5 MG tablet  Other Relevant Orders   POCT HgB A1C (Completed)     Other   Elevated BP without diagnosis of hypertension    Acute  Will increase lisinopril to 5mg  daily  Goal les than 130/80        Return in about 2 months (around 01/24/2023) for diabetes follow up .        The entirety of the information documented in the History of Present Illness, Review of Systems and Physical Exam were personally obtained by me. Portions of this information were initially documented by Aaron Parker,CMA. I, Aaron Ramp, MD have reviewed the documentation above for thoroughness and accuracy.      Aaron Ramp, MD  [facility] [phone] (phone) [phone] (fax)  [facility]

## 2022-11-24 NOTE — Patient Instructions (Addendum)
CT scheduled 11-28-22 @ 9:45 am arrival [facility].

## 2022-11-24 NOTE — Assessment & Plan Note (Addendum)
Chronic  Not yet at goal  Repeated A1c today, improved from 11 to 8.8  Increased lantus to 40 units daily  Recommended reaching out to referral coordinator for follow up with endocrinology referral  Increased ozempic to 0.5mg  weekly  Continue lisinopril, increased to 5mg  daily from 2.5mg   Patient will schedule eye exam with insurance designated provider  Foot exam completed today

## 2022-11-24 NOTE — Assessment & Plan Note (Signed)
Acute  Will increase lisinopril to 5mg  daily  Goal les than 130/80

## 2022-11-24 NOTE — Patient Instructions (Addendum)
Please increase your Lantus to 40 units daily   Increase your Ozempic to 0.5mg  once weekly    Please see me in 2 months for diabetes and blood pressure   Your A1c has decreased to 8.8 from 11.1

## 2022-11-25 ENCOUNTER — Encounter: Payer: Self-pay | Admitting: Family Medicine

## 2022-11-25 LAB — BASIC METABOLIC PANEL
BUN/Creatinine Ratio: 19 (ref 9–20)
BUN: 13 mg/dL (ref 6–20)
CO2: 23 mmol/L (ref 20–29)
Calcium: 10 mg/dL (ref 8.7–10.2)
Chloride: 94 mmol/L — ABNORMAL LOW (ref 96–106)
Creatinine, Ser: 0.67 mg/dL — ABNORMAL LOW (ref 0.76–1.27)
Glucose: 168 mg/dL — ABNORMAL HIGH (ref 70–99)
Potassium: 4.5 mmol/L (ref 3.5–5.2)
Sodium: 135 mmol/L (ref 134–144)
eGFR: 134 mL/min/{1.73_m2} (ref >59)

## 2022-11-25 LAB — CBC WITH DIFFERENTIAL
Basophils Absolute: 0.1 10*3/uL (ref 0.0–0.2)
Basos: 1 %
EOS (ABSOLUTE): 0.1 10*3/uL (ref 0.0–0.4)
Eos: 1 %
Hematocrit: 46.5 % (ref 37.5–51.0)
Hemoglobin: 16 g/dL (ref 13.0–17.7)
Immature Grans (Abs): 0 10*3/uL (ref 0.0–0.1)
Immature Granulocytes: 0 %
Lymphocytes Absolute: 3.3 10*3/uL — ABNORMAL HIGH (ref 0.7–3.1)
Lymphs: 42 %
MCH: 27.4 pg (ref 26.6–33.0)
MCHC: 34.4 g/dL (ref 31.5–35.7)
MCV: 80 fL (ref 79–97)
Monocytes Absolute: 0.5 10*3/uL (ref 0.1–0.9)
Monocytes: 6 %
Neutrophils Absolute: 3.9 10*3/uL (ref 1.4–7.0)
Neutrophils: 50 %
RBC: 5.83 x10E6/uL — ABNORMAL HIGH (ref 4.14–5.80)
RDW: 14.7 % (ref 11.6–15.4)
WBC: 7.9 10*3/uL (ref 3.4–10.8)

## 2022-11-25 LAB — HEPATIC FUNCTION PANEL
ALT: 67 IU/L — ABNORMAL HIGH (ref 0–44)
AST: 38 IU/L (ref 0–40)
Albumin: 5 g/dL (ref 4.3–5.2)
Alkaline Phosphatase: 71 IU/L (ref 44–121)
Bilirubin Total: 0.3 mg/dL (ref 0.0–1.2)
Bilirubin, Direct: 0.11 mg/dL (ref 0.00–0.40)
Total Protein: 7.8 g/dL (ref 6.0–8.5)

## 2022-11-25 LAB — CELIAC DISEASE AB SCREEN W/RFX
Antigliadin Abs, IgA: 6 units (ref 0–19)
IgA/Immunoglobulin A, Serum: 233 mg/dL (ref 90–386)
Transglutaminase IgA: <2 U/mL (ref 0–3)

## 2022-11-25 LAB — H. PYLORI BREATH TEST: H pylori Breath Test: NEGATIVE

## 2022-11-25 LAB — LIPASE: Lipase: 66 U/L (ref 13–78)

## 2022-11-26 ENCOUNTER — Telehealth: Payer: Self-pay

## 2022-11-26 DIAGNOSIS — R1084 Generalized abdominal pain: Secondary | ICD-10-CM

## 2022-11-26 NOTE — Addendum Note (Signed)
Addended by: Radene Knee L on: 11/26/2022 03:40 PM   Modules accepted: Orders

## 2022-11-26 NOTE — Telephone Encounter (Signed)
Patient states he already has appointment at [facility] imaging on 12/03/2022 and they have the order and everything. Called [facility] and states he is scheduled but they need the order fax to them. Faxed order to [phone]. Called central scheduling and canceled the CT scan at [facility]

## 2022-11-26 NOTE — Progress Notes (Signed)
Notify patient: 1.  ALT liver test has improved from 84 down to 67 (normal less than 44).  All other liver test are normal.  Liver test have greatly improved compared to previous labs. 2.  CBC is normal.  White count and hemoglobin are normal.  No evidence of infection or anemia. 3.  Glucose is elevated at 168.  Continue to follow-up with PCP to treat diabetes.   4.  Normal kidney test and electrolytes. 5.  H. pylori breath test is negative.  No evidence of H. pylori infection. 6.  Lipase pancreas test is normal.  No evidence of pancreatitis. 7.  Celiac labs are negative.  No evidence of gluten allergy. **Continue with current plan.

## 2022-11-26 NOTE — Telephone Encounter (Signed)
This case has been canceled because it is a duplicate authorizations. The current case number is [id number]. But the CT scan has been approved but the case has not been approved yet because they have to talk to the member the phone number for the member to call is [phone] and they have tried to call the patient 3 times. Called patient and patient states he has there number and will call them today.

## 2022-11-26 NOTE — Telephone Encounter (Signed)
Case/AuthorizationService WUJWJ:[id number] Authorization Number:[id number] Auth Effective Date:06/17/2024Auth End Date:12/14/2024Initiated Date:06/17/2024Decision Date:06/17/2024Decision Type :InitialCase Status:ApprovedDate Of Service: Patient First Name:LEELast Name:FOSTERDate of Birth:26-Oct-2002Address:[address], [place], [address]QMVHQ:[phone] NU:[id number] Insurer:CIGNA HEALTHCAREProgram:CIGNA SI-PPO/OAP Referring Physician First Name:TINALast Name:GARRETTAddress:[address], [place], [place], [id number] Phone :[phone] :[phone]:064Tax [phone] [facility]ed [facility]Address:[address], [place], [place], [address]Phone:[phone]:[phone]:CT Scan,MRI Scan,MRI Open and Closed,PET/CT Scanner,UltrasoundTax ID:[id number] Code:[id number]

## 2022-11-26 NOTE — Telephone Encounter (Signed)
-----   Message from Denman George, CMA sent at 11/25/2022  8:09 AM EDT ----- Ct SCAN PENDING   Thank you for submitting your preauthorization request. The case has been sent to [employer] for further review.  If you have any questions please contact [employer] at [phone]. Case/AuthorizationService UJWJX:[id number] Initiated Date:06/18/2024Case Activity:Submission in ProgressCase Status:PendingDate Of Service: Patient First Name:LEELast Name:FOSTERDate of Birth:2002/02/29Address:[address], [place], [address]MVHQI:[id number] Member [phone] Insurer:CIGNA HEALTHCAREProgram:CIGNA SI-PPO/OAP Referring Physician First Name:TINALast Name:GARRETTAddress:[id number] [place], [place], [id number] Phone [phone] Fax :[phone] Specialty:GASTROENTEROLOGYTax ID:[id number] Requested Facility Name:[facility]Address:[address], [place], [place], [address]Phone:[phone] [phone] Equipment:CT, MRI, NCM, OPEN MR ONLY, SPECT, [place], USGENERALTax ID:[id number] Code:[id number]

## 2022-11-27 ENCOUNTER — Telehealth: Payer: Self-pay

## 2022-11-27 NOTE — Telephone Encounter (Signed)
Left message for patient to return call to office.   .  ALT liver test has improved from 84 down to 67 (normal less than 44).  All other liver test are normal.  Liver test have greatly improved compared to previous labs.  2.  CBC is normal.  White count and hemoglobin are normal.  No evidence of infection or anemia.  3.  Glucose is elevated at 168.  Continue to follow-up with PCP to treat diabetes.   4.  Normal kidney test and electrolytes.  5.  H. pylori breath test is negative.  No evidence of H. pylori infection.  6.  Lipase pancreas test is normal.  No evidence of pancreatitis.  7.  Celiac labs are negative.  No evidence of gluten allergy.  **Continue with current plan.

## 2022-11-28 ENCOUNTER — Ambulatory Visit: Payer: Managed Care, Other (non HMO)

## 2022-12-03 ENCOUNTER — Telehealth: Payer: Self-pay | Admitting: Family Medicine

## 2022-12-03 NOTE — Telephone Encounter (Signed)
  Short-Term Disability paperwork was received on 12/03/2022 & placed in provider's mailbox. Please allow 7-10 business days to be completed. Thank you

## 2022-12-05 ENCOUNTER — Telehealth: Payer: Self-pay | Admitting: Family Medicine

## 2022-12-05 DIAGNOSIS — Z0279 Encounter for issue of other medical certificate: Secondary | ICD-10-CM

## 2022-12-05 NOTE — Telephone Encounter (Signed)
Left vm to inform paperwork was complete.

## 2022-12-05 NOTE — Telephone Encounter (Signed)
Paperwork has been completed. Please notify pt

## 2022-12-19 ENCOUNTER — Other Ambulatory Visit: Payer: Self-pay | Admitting: Family Medicine

## 2022-12-19 NOTE — Telephone Encounter (Signed)
Requested Prescriptions  Pending Prescriptions Disp Refills   metFORMIN (GLUCOPHAGE-XR) 750 MG 24 hr tablet [Pharmacy Med Name: metFORMIN HCl ER 750 MG Oral Tablet Extended Release 24 Hour] 90 tablet     Sig: Take 1 tablet by mouth once daily with breakfast     Endocrinology:  Diabetes - Biguanides Failed - 12/19/2022 12:14 PM      Failed - Cr in normal range and within 360 days    Creatinine, Ser  Date Value Ref Range Status  11/24/2022 0.67 (L) 0.76 - 1.27 mg/dL Final         Failed - HBA1C is between 0 and 7.9 and within 180 days    Hemoglobin A1C  Date Value Ref Range Status  11/24/2022 8.8 (A) 4.0 - 5.6 % Final   Hgb A1c MFr Bld  Date Value Ref Range Status  08/04/2022 11.4 (H) 4.8 - 5.6 % Final    Comment:             Prediabetes: 5.7 - 6.4          Diabetes: >6.4          Glycemic control for adults with diabetes: <7.0          Failed - B12 Level in normal range and within 720 days    No results found for: "VITAMINB12"       Failed - CBC within normal limits and completed in the last 12 months    WBC  Date Value Ref Range Status  11/24/2022 7.9 3.4 - 10.8 x10E3/uL Final  08/17/2022 6.2 4.0 - 10.5 K/uL Final   RBC  Date Value Ref Range Status  11/24/2022 5.83 (H) 4.14 - 5.80 x10E6/uL Final  08/17/2022 5.25 4.22 - 5.81 MIL/uL Final   Hemoglobin  Date Value Ref Range Status  11/24/2022 16.0 13.0 - 17.7 g/dL Final   Hematocrit  Date Value Ref Range Status  11/24/2022 46.5 37.5 - 51.0 % Final   MCHC  Date Value Ref Range Status  11/24/2022 34.4 31.5 - 35.7 g/dL Final  46/27/0350 09.3 30.0 - 36.0 g/dL Final   [facility]  Date Value Ref Range Status  11/24/2022 27.4 26.6 - 33.0 pg Final  08/17/2022 26.9 26.0 - 34.0 pg Final   MCV  Date Value Ref Range Status  11/24/2022 80 79 - 97 fL Final   No results found for: "PLTCOUNTKUC", "LABPLAT", "POCPLA" RDW  Date Value Ref Range Status  11/24/2022 14.7 11.6 - 15.4 % Final         Passed - eGFR in normal range  and within 360 days    GFR calc Af Amer  Date Value Ref Range Status  12/16/2019 >60 >60 mL/min Final   GFR, Estimated  Date Value Ref Range Status  08/17/2022 >60 >60 mL/min Final    Comment:    (NOTE) Calculated using the CKD-EPI Creatinine Equation (2021)    eGFR  Date Value Ref Range Status  11/24/2022 134 >59 mL/min/1.73 Final         Passed - Valid encounter within last 6 months    Recent Outpatient Visits           3 weeks ago Type 2 diabetes mellitus with hyperglycemia, with long-term current use of insulin (HCC)   [place] [facility] Simmons-Robinson, [place], MD   1 month ago Type 2 diabetes mellitus with hyperglycemia, with long-term current use of insulin (HCC)   [place] [facility] [name], MD  3 months ago Type 2 diabetes mellitus with hyperglycemia, with long-term current use of insulin (HCC)   [facility] Simmons-Robinson, [place], MD   4 months ago Hyperglycemia due to diabetes mellitus (HCC)   [place] [facility] Simmons-Robinson, [place], MD   4 months ago LUQ abdominal pain   [place] [facility] Simmons-Robinson, [place], MD       Future Appointments             In 1 month Simmons-Robinson, Tawanna Cooler, MD [facility]             Semaglutide,0.25 or 0.5MG /DOS, (OZEMPIC, 0.25 OR 0.5 MG/DOSE,) 2 MG/3ML SOPN [Pharmacy Med Name: Ozempic (0.25 or 0.5 MG/DOSE) 2 MG/3ML Subcutaneous Solution Pen-injector] 3 mL     Sig: INJECT 0.25MG  SUBCUTANEOUSLY ONCE WEEKLY     Endocrinology:  Diabetes - GLP-1 Receptor Agonists - semaglutide Failed - 12/19/2022 12:14 PM      Failed - HBA1C in normal range and within 180 days    Hemoglobin A1C  Date Value Ref Range Status  11/24/2022 8.8 (A) 4.0 - 5.6 % Final   Hgb A1c MFr Bld  Date Value Ref Range Status  08/04/2022 11.4 (H) 4.8 - 5.6 % Final    Comment:              Prediabetes: 5.7 - 6.4          Diabetes: >6.4          Glycemic control for adults with diabetes: <7.0          Failed - Cr in normal range and within 360 days    Creatinine, Ser  Date Value Ref Range Status  11/24/2022 0.67 (L) 0.76 - 1.27 mg/dL Final         Passed - Valid encounter within last 6 months    Recent Outpatient Visits           3 weeks ago Type 2 diabetes mellitus with hyperglycemia, with long-term current use of insulin (HCC)   [place] [facility] Simmons-Robinson, [place], MD   1 month ago Type 2 diabetes mellitus with hyperglycemia, with long-term current use of insulin (HCC)   [place] [facility] Simmons-Robinson, [place], MD   3 months ago Type 2 diabetes mellitus with hyperglycemia, with long-term current use of insulin (HCC)   [place] [facility] Simmons-Robinson, [place], MD   4 months ago Hyperglycemia due to diabetes mellitus (HCC)   [place] [facility] Simmons-Robinson, [place], MD   4 months ago LUQ abdominal pain   [place] [facility] Simmons-Robinson, [place], MD       Future Appointments             In 1 month Simmons-Robinson, Tawanna Cooler, MD [facility]

## 2022-12-19 NOTE — Telephone Encounter (Signed)
Requested medication (s) are due for refill today: yes  Requested medication (s) are on the active medication list: yes  Last refill:  08/07/22 #30 3 RF  Future visit scheduled: yes  Notes to clinic:  labs outside normal range   Requested Prescriptions  Pending Prescriptions Disp Refills   metFORMIN (GLUCOPHAGE-XR) 750 MG 24 hr tablet [Pharmacy Med Name: metFORMIN HCl ER 750 MG Oral Tablet Extended Release 24 Hour] 90 tablet     Sig: Take 1 tablet by mouth once daily with breakfast     Endocrinology:  Diabetes - Biguanides Failed - 12/19/2022 12:14 PM      Failed - Cr in normal range and within 360 days    Creatinine, Ser  Date Value Ref Range Status  11/24/2022 0.67 (L) 0.76 - 1.27 mg/dL Final         Failed - HBA1C is between 0 and 7.9 and within 180 days    Hemoglobin A1C  Date Value Ref Range Status  11/24/2022 8.8 (A) 4.0 - 5.6 % Final   Hgb A1c MFr Bld  Date Value Ref Range Status  08/04/2022 11.4 (H) 4.8 - 5.6 % Final    Comment:             Prediabetes: 5.7 - 6.4          Diabetes: >6.4          Glycemic control for adults with diabetes: <7.0          Failed - B12 Level in normal range and within 720 days    No results found for: "VITAMINB12"       Failed - CBC within normal limits and completed in the last 12 months    WBC  Date Value Ref Range Status  11/24/2022 7.9 3.4 - 10.8 x10E3/uL Final  08/17/2022 6.2 4.0 - 10.5 K/uL Final   RBC  Date Value Ref Range Status  11/24/2022 5.83 (H) 4.14 - 5.80 x10E6/uL Final  08/17/2022 5.25 4.22 - 5.81 MIL/uL Final   Hemoglobin  Date Value Ref Range Status  11/24/2022 16.0 13.0 - 17.7 g/dL Final   Hematocrit  Date Value Ref Range Status  11/24/2022 46.5 37.5 - 51.0 % Final   MCHC  Date Value Ref Range Status  11/24/2022 34.4 31.5 - 35.7 g/dL Final  09/81/1914 78.2 30.0 - 36.0 g/dL Final   [facility]  Date Value Ref Range Status  11/24/2022 27.4 26.6 - 33.0 pg Final  08/17/2022 26.9 26.0 - 34.0 pg Final   MCV   Date Value Ref Range Status  11/24/2022 80 79 - 97 fL Final   No results found for: "PLTCOUNTKUC", "LABPLAT", "POCPLA" RDW  Date Value Ref Range Status  11/24/2022 14.7 11.6 - 15.4 % Final         Passed - eGFR in normal range and within 360 days    GFR calc Af Amer  Date Value Ref Range Status  12/16/2019 >60 >60 mL/min Final   GFR, Estimated  Date Value Ref Range Status  08/17/2022 >60 >60 mL/min Final    Comment:    (NOTE) Calculated using the CKD-EPI Creatinine Equation (2021)    eGFR  Date Value Ref Range Status  11/24/2022 134 >59 mL/min/1.73 Final         Passed - Valid encounter within last 6 months    Recent Outpatient Visits           3 weeks ago Type 2 diabetes mellitus with hyperglycemia, with long-term current  use of insulin (HCC)   [place] [facility] Simmons-Robinson, [place], MD   1 month ago Type 2 diabetes mellitus with hyperglycemia, with long-term current use of insulin (HCC)   [place] [facility] Simmons-Robinson, [place], MD   3 months ago Type 2 diabetes mellitus with hyperglycemia, with long-term current use of insulin (HCC)   [place] [facility] Simmons-Robinson, [place], MD   4 months ago Hyperglycemia due to diabetes mellitus (HCC)   [place] [facility] Simmons-Robinson, [place], MD   4 months ago LUQ abdominal pain   [place] [facility] Simmons-Robinson, [place], MD       Future Appointments             In 1 month Simmons-Robinson, [place], MD [place] [facility]            Refused Prescriptions Disp Refills   Semaglutide,0.25 or 0.5MG /DOS, (OZEMPIC, 0.25 OR 0.5 MG/DOSE,) 2 MG/3ML SOPN [Pharmacy Med Name: Ozempic (0.25 or 0.5 MG/DOSE) 2 MG/3ML Subcutaneous Solution Pen-injector] 3 mL     Sig: INJECT 0.25MG  SUBCUTANEOUSLY ONCE WEEKLY     Endocrinology:  Diabetes - GLP-1 Receptor Agonists - semaglutide Failed -  12/19/2022 12:14 PM      Failed - HBA1C in normal range and within 180 days    Hemoglobin A1C  Date Value Ref Range Status  11/24/2022 8.8 (A) 4.0 - 5.6 % Final   Hgb A1c MFr Bld  Date Value Ref Range Status  08/04/2022 11.4 (H) 4.8 - 5.6 % Final    Comment:             Prediabetes: 5.7 - 6.4          Diabetes: >6.4          Glycemic control for adults with diabetes: <7.0          Failed - Cr in normal range and within 360 days    Creatinine, Ser  Date Value Ref Range Status  11/24/2022 0.67 (L) 0.76 - 1.27 mg/dL Final         Passed - Valid encounter within last 6 months    Recent Outpatient Visits           3 weeks ago Type 2 diabetes mellitus with hyperglycemia, with long-term current use of insulin (HCC)   [place] [facility] Simmons-Robinson, [place], MD   1 month ago Type 2 diabetes mellitus with hyperglycemia, with long-term current use of insulin (HCC)   [place] [facility] Simmons-Robinson, [place], MD   3 months ago Type 2 diabetes mellitus with hyperglycemia, with long-term current use of insulin (HCC)   [place] [facility] Simmons-Robinson, [place], MD   4 months ago Hyperglycemia due to diabetes mellitus (HCC)   [place] [facility] Simmons-Robinson, [place], MD   4 months ago LUQ abdominal pain    [facility] Simmons-Robinson, [place], MD       Future Appointments             In 1 month Simmons-Robinson, Tawanna Cooler, MD [facility]

## 2022-12-20 ENCOUNTER — Other Ambulatory Visit: Payer: Self-pay | Admitting: Family Medicine

## 2022-12-22 ENCOUNTER — Telehealth: Payer: Self-pay

## 2022-12-22 ENCOUNTER — Other Ambulatory Visit: Payer: Self-pay

## 2022-12-22 NOTE — Telephone Encounter (Signed)
Too soon Requested Prescriptions  Pending Prescriptions Disp Refills   OZEMPIC, 0.25 OR 0.5 MG/DOSE, 2 MG/3ML SOPN [Pharmacy Med Name: Ozempic (0.25 or 0.5 MG/DOSE) 2 MG/3ML Subcutaneous Solution Pen-injector] 3 mL 0    Sig: INJECT 0.25MG  SUBCUTANEOUSLY ONCE WEEKLY     Endocrinology:  Diabetes - GLP-1 Receptor Agonists - semaglutide Failed - 12/20/2022  9:15 AM      Failed - HBA1C in normal range and within 180 days    Hemoglobin A1C  Date Value Ref Range Status  11/24/2022 8.8 (A) 4.0 - 5.6 % Final   Hgb A1c MFr Bld  Date Value Ref Range Status  08/04/2022 11.4 (H) 4.8 - 5.6 % Final    Comment:             Prediabetes: 5.7 - 6.4          Diabetes: >6.4          Glycemic control for adults with diabetes: <7.0          Failed - Cr in normal range and within 360 days    Creatinine, Ser  Date Value Ref Range Status  11/24/2022 0.67 (L) 0.76 - 1.27 mg/dL Final         Passed - Valid encounter within last 6 months    Recent Outpatient Visits           4 weeks ago Type 2 diabetes mellitus with hyperglycemia, with long-term current use of insulin (HCC)   [place] [facility] Simmons-Robinson, [place], MD   2 months ago Type 2 diabetes mellitus with hyperglycemia, with long-term current use of insulin (HCC)   [place] [facility] Simmons-Robinson, [place], MD   3 months ago Type 2 diabetes mellitus with hyperglycemia, with long-term current use of insulin (HCC)   [place] [facility] Simmons-Robinson, [place], MD   4 months ago Hyperglycemia due to diabetes mellitus (HCC)   [place] [facility] Simmons-Robinson, [place], MD   4 months ago LUQ abdominal pain   [place] [facility] Simmons-Robinson, [place], MD       Future Appointments             In 1 month Simmons-Robinson, Tawanna Cooler, MD [facility]

## 2022-12-22 NOTE — Progress Notes (Signed)
Abdominal pelvic CT shows fatty liver.  No other acute abnormality.  Nothing worrisome.  Take dicyclomine as needed for abdominal pain and cramping.  Continue low-fat diet and regular exercise.

## 2022-12-22 NOTE — Telephone Encounter (Signed)
Left detailed message on patient's VM.Abdominal pelvic CT shows fatty liver.  No other acute abnormality.  Nothing worrisome.  Take dicyclomine as needed for abdominal pain and cramping.  Continue low-fat diet and regular exercise.

## 2022-12-26 ENCOUNTER — Ambulatory Visit: Payer: Managed Care, Other (non HMO) | Admitting: Physician Assistant

## 2022-12-30 ENCOUNTER — Telehealth: Payer: Self-pay | Admitting: Family Medicine

## 2022-12-30 ENCOUNTER — Other Ambulatory Visit: Payer: Self-pay | Admitting: Family Medicine

## 2022-12-30 NOTE — Telephone Encounter (Signed)
Covermymeds is requesting prior authorization Key: [id number] Name: Aaron Parker Ozempic 0.25 or 0.5mg /dose 2mg /15ml pen injectors

## 2022-12-30 NOTE — Telephone Encounter (Signed)
Requested Prescriptions  Pending Prescriptions Disp Refills   metFORMIN (GLUCOPHAGE-XR) 750 MG 24 hr tablet [Pharmacy Med Name: metFORMIN HCl ER 750 MG Oral Tablet Extended Release 24 Hour] 90 tablet 0    Sig: Take 1 tablet by mouth once daily with breakfast     Endocrinology:  Diabetes - Biguanides Failed - 12/30/2022  1:14 PM      Failed - Cr in normal range and within 360 days    Creatinine, Ser  Date Value Ref Range Status  11/24/2022 0.67 (L) 0.76 - 1.27 mg/dL Final         Failed - HBA1C is between 0 and 7.9 and within 180 days    Hemoglobin A1C  Date Value Ref Range Status  11/24/2022 8.8 (A) 4.0 - 5.6 % Final   Hgb A1c MFr Bld  Date Value Ref Range Status  08/04/2022 11.4 (H) 4.8 - 5.6 % Final    Comment:             Prediabetes: 5.7 - 6.4          Diabetes: >6.4          Glycemic control for adults with diabetes: <7.0          Failed - B12 Level in normal range and within 720 days    No results found for: "VITAMINB12"       Failed - CBC within normal limits and completed in the last 12 months    WBC  Date Value Ref Range Status  11/24/2022 7.9 3.4 - 10.8 x10E3/uL Final  08/17/2022 6.2 4.0 - 10.5 K/uL Final   RBC  Date Value Ref Range Status  11/24/2022 5.83 (H) 4.14 - 5.80 x10E6/uL Final  08/17/2022 5.25 4.22 - 5.81 MIL/uL Final   Hemoglobin  Date Value Ref Range Status  11/24/2022 16.0 13.0 - 17.7 g/dL Final   Hematocrit  Date Value Ref Range Status  11/24/2022 46.5 37.5 - 51.0 % Final   MCHC  Date Value Ref Range Status  11/24/2022 34.4 31.5 - 35.7 g/dL Final  82/95/6213 08.6 30.0 - 36.0 g/dL Final   [facility]  Date Value Ref Range Status  11/24/2022 27.4 26.6 - 33.0 pg Final  08/17/2022 26.9 26.0 - 34.0 pg Final   MCV  Date Value Ref Range Status  11/24/2022 80 79 - 97 fL Final   No results found for: "PLTCOUNTKUC", "LABPLAT", "POCPLA" RDW  Date Value Ref Range Status  11/24/2022 14.7 11.6 - 15.4 % Final         Passed - eGFR in normal range  and within 360 days    GFR calc Af Amer  Date Value Ref Range Status  12/16/2019 >60 >60 mL/min Final   GFR, Estimated  Date Value Ref Range Status  08/17/2022 >60 >60 mL/min Final    Comment:    (NOTE) Calculated using the CKD-EPI Creatinine Equation (2021)    eGFR  Date Value Ref Range Status  11/24/2022 134 >59 mL/min/1.73 Final         Passed - Valid encounter within last 6 months    Recent Outpatient Visits           1 month ago Type 2 diabetes mellitus with hyperglycemia, with long-term current use of insulin (HCC)   [place] [facility] Simmons-Robinson, [place], MD   2 months ago Type 2 diabetes mellitus with hyperglycemia, with long-term current use of insulin (HCC)   [place] [facility] [name], MD  3 months ago Type 2 diabetes mellitus with hyperglycemia, with long-term current use of insulin (HCC)   [place] [facility] Simmons-Robinson, [place], MD   4 months ago Hyperglycemia due to diabetes mellitus (HCC)   [place] [facility] Simmons-Robinson, [place], MD   4 months ago LUQ abdominal pain   [place] [facility] Simmons-Robinson, [place], MD       Future Appointments             In 4 weeks Simmons-Robinson, Tawanna Cooler, MD [facility]             OZEMPIC, 0.25 OR 0.5 MG/DOSE, 2 MG/3ML SOPN [Pharmacy Med Name: Ozempic (0.25 or 0.5 MG/DOSE) 2 MG/3ML Subcutaneous Solution Pen-injector] 3 mL 0    Sig: INJECT 0.25MG  SUBCUTANEOUSLY ONCE WEEKLY     Endocrinology:  Diabetes - GLP-1 Receptor Agonists - semaglutide Failed - 12/30/2022  1:14 PM      Failed - HBA1C in normal range and within 180 days    Hemoglobin A1C  Date Value Ref Range Status  11/24/2022 8.8 (A) 4.0 - 5.6 % Final   Hgb A1c MFr Bld  Date Value Ref Range Status  08/04/2022 11.4 (H) 4.8 - 5.6 % Final    Comment:             Prediabetes: 5.7 - 6.4           Diabetes: >6.4          Glycemic control for adults with diabetes: <7.0          Failed - Cr in normal range and within 360 days    Creatinine, Ser  Date Value Ref Range Status  11/24/2022 0.67 (L) 0.76 - 1.27 mg/dL Final         Passed - Valid encounter within last 6 months    Recent Outpatient Visits           1 month ago Type 2 diabetes mellitus with hyperglycemia, with long-term current use of insulin (HCC)   [place] [facility] Simmons-Robinson, [place], MD   2 months ago Type 2 diabetes mellitus with hyperglycemia, with long-term current use of insulin (HCC)   [place] [facility] Simmons-Robinson, [place], MD   3 months ago Type 2 diabetes mellitus with hyperglycemia, with long-term current use of insulin (HCC)   [place] [facility] Simmons-Robinson, [place], MD   4 months ago Hyperglycemia due to diabetes mellitus (HCC)   [place] [facility] Simmons-Robinson, [place], MD   4 months ago LUQ abdominal pain   [place] [facility] Simmons-Robinson, [place], MD       Future Appointments             In 4 weeks Simmons-Robinson, Tawanna Cooler, MD [facility]

## 2023-01-08 NOTE — Telephone Encounter (Signed)
Per Covermymeds, Ozempic approved. [facility] pharmacy confirmed.

## 2023-01-19 ENCOUNTER — Encounter: Payer: Self-pay | Admitting: Family Medicine

## 2023-01-28 ENCOUNTER — Ambulatory Visit (INDEPENDENT_AMBULATORY_CARE_PROVIDER_SITE_OTHER): Payer: Managed Care, Other (non HMO) | Admitting: Family Medicine

## 2023-01-28 ENCOUNTER — Encounter: Payer: Self-pay | Admitting: Family Medicine

## 2023-01-28 VITALS — BP 132/85 | HR 91 | Temp 97.6°F | Resp 12 | Ht 70.0 in | Wt 228.7 lb

## 2023-01-28 DIAGNOSIS — R109 Unspecified abdominal pain: Secondary | ICD-10-CM

## 2023-01-28 DIAGNOSIS — K219 Gastro-esophageal reflux disease without esophagitis: Secondary | ICD-10-CM | POA: Diagnosis not present

## 2023-01-28 DIAGNOSIS — E1142 Type 2 diabetes mellitus with diabetic polyneuropathy: Secondary | ICD-10-CM | POA: Diagnosis not present

## 2023-01-28 DIAGNOSIS — Z794 Long term (current) use of insulin: Secondary | ICD-10-CM | POA: Diagnosis not present

## 2023-01-28 DIAGNOSIS — G8929 Other chronic pain: Secondary | ICD-10-CM

## 2023-01-28 MED ORDER — GABAPENTIN 100 MG PO CAPS: 100.0000 mg | ORAL_CAPSULE | Freq: Three times a day (TID) | ORAL | 3 refills | Status: DC

## 2023-01-28 NOTE — Assessment & Plan Note (Signed)
Ongoing despite Bentyl and Omeprazole. Patient reports decreased frequency of bowel movements but still experiences discomfort. -Chronic,improved -Increase Bentyl to three times a day before meals. -Patient to follow up with GI for further evaluation.

## 2023-01-28 NOTE — Patient Instructions (Addendum)
Endocrinology Information 02/03/23  [address] [place] [address]   Phone: [phone]   VISIT SUMMARY:  During your visit, we discussed your diabetes, the new onset of neuropathy symptoms in your hands, and your ongoing abdominal discomfort. We made some changes to your medication regimen to better manage these conditions.  YOUR PLAN:  -DIABETIC NEUROPATHY: This is a type of nerve damage that can occur if you have diabetes. High blood sugar can injure nerves throughout your body. We are starting you on Gabapentin 100mg  at bedtime to help manage the fuzzy overwhelming sensation in your hands. We may increase the dose as needed and as you tolerate it.  -TYPE 2 DIABETES MELLITUS: This is a chronic condition that affects the way your body metabolizes sugar (glucose), your body's important source of fuel. We are increasing your Lantus to 40 units daily and continuing your Ozempic 0.5mg  weekly. We will check your A1C, a test that measures your average blood sugar level over the past 2 to 3 months, in early October.  -ABDOMINAL DISCOMFORT: Elvera Bicker been experiencing discomfort in your abdomen and frequent bowel movements. We are increasing your Bentyl to three times a day before meals and you will follow up with a gastroenterologist for further evaluation.  INSTRUCTIONS:  You have your first endocrinology appointment scheduled for 02/03/2023. Please also make sure to schedule a follow-up appointment in early October for an A1C check and overall diabetes management.

## 2023-01-28 NOTE — Assessment & Plan Note (Addendum)
Fasting blood glucose levels still elevated (150-180), despite current regimen of Ozempic 0.5mg  weekly and Lantus 35 units daily. -Chronic, not yet at goal  -Increase Lantus to 40 units daily. -Continue Ozempic 0.5mg  weekly. -Check A1C in early October.  New onset of neuropathic symptoms in hands, described as a fuzzy overwhelming sensation. No weakness or functional impairment reported. -Start Gabapentin 100mg  at bedtime, with potential to increase dose as tolerated and needed for symptom control.

## 2023-01-28 NOTE — Progress Notes (Signed)
Established patient visit   Patient: Aaron Parker   DOB: [birth year]   22 y.o. Male  MRN: [id number] Visit Date: 01/28/2023  Today's healthcare provider: [name], MD   Chief Complaint  Patient presents with   Medical Management of Chronic Issues    11/24/22 DM-increase lantus to 40 units daily. Increas Ozempic to 0.5 mg weekly. Continue lisinopril 5 mg. Patient reports good compliance and tolerance to medications. Fasting blood sugar-150-180s   Subjective     HPI     Medical Management of Chronic Issues    Additional comments: 11/24/22 DM-increase lantus to 40 units daily. Increas Ozempic to 0.5 mg weekly. Continue lisinopril 5 mg. Patient reports good compliance and tolerance to medications. Fasting blood sugar-150-180s      Last edited by [name], CMA on 01/28/2023  8:15 AM.       Discussed the use of AI scribe software for clinical note transcription with the patient, who gave verbal consent to proceed.  History of Present Illness   Aaron Parker, a patient with diabetes, presents with neuropathy symptoms primarily affecting the hands. The patient describes the sensation as an overwhelming, fuzzy feeling, which is not alleviated by wearing gloves. The patient denies significant neuropathy symptoms in the feet, although notes occasional discomfort when feet are under a blanket.  The patient also reports abdominal discomfort and frequent bowel movements, despite being on Bentyl and Omeprazole. 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  Regarding diabetes management, the patient has been on Ozempic 0.5 and Lantus 35 units, with fasting blood glucose levels ranging from 150 to 180.  The patient also reports a recent change in insulin administration site due to discomfort and the formation of temporary subcutaneous lumps. Despite these  challenges, the patient is committed to improving diabetes control and is open to increasing the Lantus dose to 40 units.       GI: recommend return to work 02/02/23, has bentyl, followed by gastroenterology   Medications: Outpatient Medications Prior to Visit  Medication Sig   Blood Glucose Monitoring Suppl (ONE TOUCH ULTRA 2) w/Device KIT USE TO TEST BLOOD SUGAR IN THE MORNING, AT NOON, AND AT BEDTIME   dicyclomine (BENTYL) 10 MG capsule Take 1 capsule (10 mg total) by mouth 3 (three) times daily before meals.   Glucose Blood (BLOOD GLUCOSE TEST STRIPS) STRP 1 each by In Vitro route in the morning and at bedtime. May substitute to any manufacturer covered by patient's insurance.   insulin glargine (LANTUS) 100 UNIT/ML Solostar Pen Inject 40 Units into the skin daily.   Insulin Pen Needle 31G X 8 MM MISC 1 Needle by Does not apply route in the morning and at bedtime.   Lancet Device MISC Use to test blood glucose while fasting in the morning and 2 hours largest meal of the day   Lancets Misc. MISC Use to test blood glucose while fasting in the morning and 2 hours largest meal of the day   lisinopril (ZESTRIL) 5 MG tablet Take 1 tablet (5 mg total) by mouth daily.   metFORMIN (GLUCOPHAGE-XR) 750 MG 24 hr tablet Take 1 tablet by mouth once daily with breakfast   omeprazole (PRILOSEC) 20 MG capsule Take 1 capsule (20 mg total) by mouth daily.   OZEMPIC, 0.25 OR 0.5 MG/DOSE, 2 MG/3ML SOPN INJECT 0.25MG  SUBCUTANEOUSLY ONCE WEEKLY   No facility-administered  medications prior to visit.    Review of Systems  Last metabolic panel Lab Results  Component Value Date   GLUCOSE 168 (H) 11/24/2022   NA 135 11/24/2022   K 4.5 11/24/2022   CL 94 (L) 11/24/2022   CO2 23 11/24/2022   BUN 13 11/24/2022   CREATININE 0.67 (L) 11/24/2022   EGFR 134 11/24/2022   CALCIUM 10.0 11/24/2022   PROT 7.8 11/24/2022   ALBUMIN 5.0 11/24/2022   LABGLOB 2.4 08/04/2022   AGRATIO 1.8 08/04/2022   BILITOT 0.3  11/24/2022   ALKPHOS 71 11/24/2022   AST 38 11/24/2022   ALT 67 (H) 11/24/2022   ANIONGAP 8 08/17/2022     Lab Results  Component Value Date   HGBA1C 8.8 (A) 11/24/2022       Objective    BP 132/85 (BP Location: Left Arm, Patient Position: Sitting, Cuff Size: Large)   Pulse 91   Temp 97.6 F (36.4 C) (Temporal)   Resp 12   Ht 5\' 10"  (1.778 m)   Wt 228 lb 11.2 oz (103.7 kg)   SpO2 97%   BMI 32.82 kg/m     Physical Exam Vitals reviewed.  Constitutional:      General: He is not in acute distress.    Appearance: Normal appearance. He is not ill-appearing, toxic-appearing or diaphoretic.  Eyes:     Conjunctiva/sclera: Conjunctivae normal.  Cardiovascular:     Rate and Rhythm: Normal rate and regular rhythm.     Pulses: Normal pulses.     Heart sounds: Normal heart sounds. No murmur heard.    No friction rub. No gallop.  Pulmonary:     Effort: Pulmonary effort is normal. No respiratory distress.     Breath sounds: Normal breath sounds. No stridor. No wheezing, rhonchi or rales.  Abdominal:     General: Bowel sounds are normal. There is no distension.     Palpations: Abdomen is soft.     Tenderness: There is no abdominal tenderness.  Musculoskeletal:     Right lower leg: No edema.     Left lower leg: No edema.  Skin:    Findings: No erythema or rash.  Neurological:     Mental Status: He is alert and oriented to person, place, and time.       No results found for any visits on 01/28/23.  Assessment & Plan     Problem List Items Addressed This Visit     Chronic abdominal pain    Ongoing despite Bentyl and Omeprazole. Patient reports decreased frequency of bowel movements but still experiences discomfort. -Chronic,improved -Increase Bentyl to three times a day before meals. -Patient to follow up with GI for further evaluation.      Relevant Medications   gabapentin (NEURONTIN) 100 MG capsule   GERD (gastroesophageal reflux disease)    Chronic Stable   Continue omeprazole 20mg  daily       Type 2 diabetes mellitus with hyperglycemia, with long-term current use of insulin (HCC) - Primary    Fasting blood glucose levels still elevated (150-180), despite current regimen of Ozempic 0.5mg  weekly and Lantus 35 units daily. -Chronic, not yet at goal  -Increase Lantus to 40 units daily. -Continue Ozempic 0.5mg  weekly. -Check A1C in early October.  New onset of neuropathic symptoms in hands, described as a fuzzy overwhelming sensation. No weakness or functional impairment reported. -Start Gabapentin 100mg  at bedtime, with potential to increase dose as tolerated and needed for symptom control.  Relevant Medications   gabapentin (NEURONTIN) 100 MG capsule       Return in about 6 weeks (around 03/11/2023) for DM.       [name], MD  [facility] [phone] (phone) [phone] (fax)  [facility]

## 2023-01-28 NOTE — Assessment & Plan Note (Signed)
Chronic Stable Continue omeprazole 20 mg daily

## 2023-02-05 ENCOUNTER — Telehealth: Payer: Self-pay

## 2023-02-05 DIAGNOSIS — Z0279 Encounter for issue of other medical certificate: Secondary | ICD-10-CM

## 2023-02-05 NOTE — Telephone Encounter (Signed)
We received disability forms for patient from [facility] group. Ruben Gottron PA review the papers and states that PCP needs to fill out the forms because it does not meet Criteria for STD from a GI stand point. Called patient and he states they sent the forms to his PCP as well and verbalized understanding

## 2023-02-13 ENCOUNTER — Other Ambulatory Visit: Payer: Self-pay | Admitting: Family Medicine

## 2023-02-13 NOTE — Telephone Encounter (Signed)
Requested Prescriptions  Pending Prescriptions Disp Refills   OZEMPIC, 0.25 OR 0.5 MG/DOSE, 2 MG/3ML SOPN [Pharmacy Med Name: Ozempic (0.25 or 0.5 MG/DOSE) 2 MG/3ML Subcutaneous Solution Pen-injector] 3 mL 0    Sig: INJECT 0.25MG  SUBCUTANEOUSLY ONCE WEEKLY     Endocrinology:  Diabetes - GLP-1 Receptor Agonists - semaglutide Failed - 02/13/2023  9:17 AM      Failed - HBA1C in normal range and within 180 days    Hemoglobin A1C  Date Value Ref Range Status  11/24/2022 8.8 (A) 4.0 - 5.6 % Final   Hgb A1c MFr Bld  Date Value Ref Range Status  08/04/2022 11.4 (H) 4.8 - 5.6 % Final    Comment:             Prediabetes: 5.7 - 6.4          Diabetes: >6.4          Glycemic control for adults with diabetes: <7.0          Failed - Cr in normal range and within 360 days    Creatinine, Ser  Date Value Ref Range Status  11/24/2022 0.67 (L) 0.76 - 1.27 mg/dL Final         Passed - Valid encounter within last 6 months    Recent Outpatient Visits           2 weeks ago Type 2 diabetes mellitus with diabetic polyneuropathy, with long-term current use of insulin (HCC)   [place] [facility] Simmons-Robinson, [place], MD   2 months ago Type 2 diabetes mellitus with hyperglycemia, with long-term current use of insulin (HCC)   [place] [facility] Simmons-Robinson, [place], MD   3 months ago Type 2 diabetes mellitus with hyperglycemia, with long-term current use of insulin (HCC)   [place] [facility] Simmons-Robinson, [place], MD   5 months ago Type 2 diabetes mellitus with hyperglycemia, with long-term current use of insulin (HCC)   [place] [facility] Simmons-Robinson, [place], MD   6 months ago Hyperglycemia due to diabetes mellitus (HCC)   [place] [facility] Simmons-Robinson, Tawanna Cooler, MD       Future Appointments             In 3 weeks Simmons-Robinson, Tawanna Cooler, MD [facility]

## 2023-03-10 DIAGNOSIS — Z794 Long term (current) use of insulin: Secondary | ICD-10-CM | POA: Insufficient documentation

## 2023-03-10 DIAGNOSIS — E1142 Type 2 diabetes mellitus with diabetic polyneuropathy: Secondary | ICD-10-CM | POA: Insufficient documentation

## 2023-03-10 NOTE — Progress Notes (Deleted)
Established patient visit   Patient: Aaron Parker   DOB: [birth year]   22 y.o. Male  MRN: [id number] Visit Date: 03/11/2023  Today's healthcare provider: [name], MD   No chief complaint on file.  Subjective       Discussed the use of AI scribe software for clinical note transcription with the patient, who gave verbal consent to proceed.  History of Present Illness           Needs DM eye exam  Will establish with endocrinology on 03/16/23 Has GI follow up 03/12/23 for chronic abdominal pain   Past Medical History:  Diagnosis Date   Chronic abdominal pain    Diabetes (HCC)    Eczema    GERD (gastroesophageal reflux disease)    Muscle strain    Wolff-Parkinson-White (WPW) syndrome     Medications: Outpatient Medications Prior to Visit  Medication Sig   Blood Glucose Monitoring Suppl (ONE TOUCH ULTRA 2) w/Device KIT USE TO TEST BLOOD SUGAR IN THE MORNING, AT NOON, AND AT BEDTIME   dicyclomine (BENTYL) 10 MG capsule Take 1 capsule (10 mg total) by mouth 3 (three) times daily before meals.   gabapentin (NEURONTIN) 100 MG capsule Take 1 capsule (100 mg total) by mouth 3 (three) times daily.   Glucose Blood (BLOOD GLUCOSE TEST STRIPS) STRP 1 each by In Vitro route in the morning and at bedtime. May substitute to any manufacturer covered by patient's insurance.   insulin glargine (LANTUS) 100 UNIT/ML Solostar Pen Inject 40 Units into the skin daily.   Insulin Pen Needle 31G X 8 MM MISC 1 Needle by Does not apply route in the morning and at bedtime.   Lancet Device MISC Use to test blood glucose while fasting in the morning and 2 hours largest meal of the day   Lancets Misc. MISC Use to test blood glucose while fasting in the morning and 2 hours largest meal of the day   lisinopril (ZESTRIL) 5 MG tablet Take 1 tablet (5 mg total) by mouth daily.   metFORMIN (GLUCOPHAGE-XR) 750 MG 24 hr tablet Take 1 tablet by mouth once daily with breakfast   omeprazole  (PRILOSEC) 20 MG capsule Take 1 capsule (20 mg total) by mouth daily.   OZEMPIC, 0.25 OR 0.5 MG/DOSE, 2 MG/3ML SOPN INJECT 0.25MG  SUBCUTANEOUSLY ONCE WEEKLY   No facility-administered medications prior to visit.    Review of Systems  Last metabolic panel Lab Results  Component Value Date   GLUCOSE 168 (H) 11/24/2022   NA 135 11/24/2022   K 4.5 11/24/2022   CL 94 (L) 11/24/2022   CO2 23 11/24/2022   BUN 13 11/24/2022   CREATININE 0.67 (L) 11/24/2022   EGFR 134 11/24/2022   CALCIUM 10.0 11/24/2022   PROT 7.8 11/24/2022   ALBUMIN 5.0 11/24/2022   LABGLOB 2.4 08/04/2022   AGRATIO 1.8 08/04/2022   BILITOT 0.3 11/24/2022   ALKPHOS 71 11/24/2022   AST 38 11/24/2022   ALT 67 (H) 11/24/2022   ANIONGAP 8 08/17/2022   Last hemoglobin A1c Lab Results  Component Value Date   HGBA1C 8.8 (A) 11/24/2022     {See past labs  Heme  Chem  Endocrine  Serology  Results Review (optional):1}   Objective    There were no vitals taken for this visit. BP Readings from Last 3 Encounters:  01/28/23 132/85  11/24/22 (!) 135/92  11/24/22 (!) 137/94   Wt Readings from Last 3 Encounters:  01/28/23 228  lb 11.2 oz (103.7 kg)  11/24/22 225 lb 3.2 oz (102.2 kg)  11/24/22 226 lb 4.8 oz (102.6 kg)    {See vitals history (optional):1}   Physical Exam  ***  No results found for any visits on 03/11/23.  Assessment & Plan     Problem List Items Addressed This Visit   None               No follow-ups on file.         [name], MD  [facility] [phone] (phone) [phone] (fax)  [facility]

## 2023-03-11 ENCOUNTER — Ambulatory Visit: Payer: Managed Care, Other (non HMO) | Admitting: Family Medicine

## 2023-03-11 DIAGNOSIS — E1142 Type 2 diabetes mellitus with diabetic polyneuropathy: Secondary | ICD-10-CM

## 2023-03-11 DIAGNOSIS — R030 Elevated blood-pressure reading, without diagnosis of hypertension: Secondary | ICD-10-CM

## 2023-03-12 ENCOUNTER — Ambulatory Visit: Payer: Managed Care, Other (non HMO) | Admitting: Physician Assistant

## 2023-06-19 ENCOUNTER — Other Ambulatory Visit: Payer: Self-pay | Admitting: Family Medicine

## 2023-06-19 ENCOUNTER — Encounter: Payer: Self-pay | Admitting: Family Medicine

## 2023-06-19 ENCOUNTER — Ambulatory Visit (INDEPENDENT_AMBULATORY_CARE_PROVIDER_SITE_OTHER): Payer: 59 | Admitting: Family Medicine

## 2023-06-19 VITALS — BP 137/93 | HR 93 | Ht 70.5 in | Wt 222.2 lb

## 2023-06-19 DIAGNOSIS — I152 Hypertension secondary to endocrine disorders: Secondary | ICD-10-CM | POA: Diagnosis not present

## 2023-06-19 DIAGNOSIS — E1165 Type 2 diabetes mellitus with hyperglycemia: Secondary | ICD-10-CM

## 2023-06-19 DIAGNOSIS — Z23 Encounter for immunization: Secondary | ICD-10-CM | POA: Diagnosis not present

## 2023-06-19 DIAGNOSIS — K219 Gastro-esophageal reflux disease without esophagitis: Secondary | ICD-10-CM

## 2023-06-19 DIAGNOSIS — E1159 Type 2 diabetes mellitus with other circulatory complications: Secondary | ICD-10-CM

## 2023-06-19 DIAGNOSIS — E1142 Type 2 diabetes mellitus with diabetic polyneuropathy: Secondary | ICD-10-CM

## 2023-06-19 DIAGNOSIS — Z794 Long term (current) use of insulin: Secondary | ICD-10-CM

## 2023-06-19 MED ORDER — INSULIN PEN NEEDLE 31G X 8 MM MISC: 1.0000 | Freq: Two times a day (BID) | 6 refills | Status: DC

## 2023-06-19 MED ORDER — OZEMPIC (0.25 OR 0.5 MG/DOSE) 2 MG/3ML ~~LOC~~ SOPN: 0.5000 mg | PEN_INJECTOR | SUBCUTANEOUS | 1 refills | Status: DC

## 2023-06-19 MED ORDER — INSULIN GLARGINE 100 UNIT/ML SOLOSTAR PEN: 40.0000 [IU] | PEN_INJECTOR | Freq: Every day | SUBCUTANEOUS | 2 refills | Status: DC

## 2023-06-19 MED ORDER — LISINOPRIL 5 MG PO TABS: 5.0000 mg | ORAL_TABLET | Freq: Every day | ORAL | 3 refills | Status: DC

## 2023-06-19 MED ORDER — GABAPENTIN 100 MG PO CAPS: 100.0000 mg | ORAL_CAPSULE | Freq: Three times a day (TID) | ORAL | 3 refills | Status: DC

## 2023-06-19 MED ORDER — LIRAGLUTIDE 18 MG/3ML ~~LOC~~ SOPN
0.6000 mg | PEN_INJECTOR | Freq: Every day | SUBCUTANEOUS | 0 refills | Status: DC
Start: 2023-06-19 — End: 2023-09-01

## 2023-06-19 MED ORDER — METFORMIN HCL ER 750 MG PO TB24: 750.0000 mg | ORAL_TABLET | Freq: Every day | ORAL | 3 refills | Status: DC

## 2023-06-19 NOTE — Assessment & Plan Note (Deleted)
** -  Chronic, not yet at goal  -continue Lantus to 40 units daily. -Continue Ozempic 0.5mg  weekly. -Check A1C   - Gabapentin 100mg  at bedtime for neuropathy symptoms

## 2023-06-19 NOTE — Assessment & Plan Note (Signed)
Chronic Stable Continue omeprazole 20 mg daily

## 2023-06-19 NOTE — Progress Notes (Addendum)
 Established patient visit   Patient: Aaron Parker   DOB: [birth year]   22 y.o. Male  MRN: [id number] Visit Date: 06/19/2023  Today's healthcare provider: Rockie Agent, MD   Chief Complaint  Patient presents with   Hypertension   Diabetes   Subjective     HPI     Hypertension   Anxiety: Present.  Blurred vision: Present.  Chest pain: Absent.  Chest pressure/discomfort: Absent.  Dyspnea: Absent.  Headaches: Present.  Lower extremity edema: Absent.  Orthopnea: Absent.  Palpitations: Absent.  Paroxysmal nocturnal dyspnea: Absent.  Syncope: Present.  The patient rarely exercises.        Diabetes   Blurred vision: Present.  Chest pain: Absent.  Fatigue: Present.  Foot Ulcerations: Absent.  Nausea: Absent.  Paresthesia of the feet: Absent.  Polydipsia: Present.  Polyuria: Present.  Visual changes: Present.  Vomiting: Absent.  Weight loss: Absent.  Episodes of hypoglycemia: Absent.  Eye exam is current.  Patient does not see a podiatrist.  The patient rarely exercises.        Comments   Patient reports losing his insurance and now having a new one. Would like to restart medications. Also would like to know if insulin  expires      Last edited by Lilian Fitzpatrick, CMA on 06/19/2023  9:04 AM.       Discussed the use of AI scribe software for clinical note transcription with the patient, who gave verbal consent to proceed.  History of Present Illness   The patient, with a history of diabetes and hypertension, presents with concerns about his insulin  regimen. He reports a recent job change which led to a temporary loss of insurance, causing a disruption in his medication regimen. He has not been taking his prescribed insulin  or Ozempic  since mid-December due to concerns about the expiration of his insulin , which was still in the box and refrigerated. He reports feeling the effects of his uncontrolled diabetes for the past few months.  The patient also reports a  change in his blood pressure medication regimen, having increased his Lisinopril  dosage from 2.5mg  to 5mg  daily. He has been managing his diabetes with Metformin  750mg  once daily, but has not been taking it recently due to the aforementioned insurance issues.  The patient has a new job that is less physically demanding but requires him to sit for extended periods. He reports frequent minor cuts on his hands from work-related activities. He has received two doses of the Pfizer COVID-19 vaccine in April and May of the previous year. He has not received a flu shot or pneumonia vaccine. He is due for a tetanus vaccine, having last received one in 2011.         Past Medical History:  Diagnosis Date   Chronic abdominal pain    Diabetes (HCC)    Eczema    GERD (gastroesophageal reflux disease)    Muscle strain    Wolff-Parkinson-White (WPW) syndrome     Medications: Outpatient Medications Prior to Visit  Medication Sig   Blood Glucose Monitoring Suppl (ONE TOUCH ULTRA 2) w/Device KIT USE TO TEST BLOOD SUGAR IN THE MORNING, AT NOON, AND AT BEDTIME   omeprazole  (PRILOSEC) 20 MG capsule Take 1 capsule (20 mg total) by mouth daily.   dicyclomine  (BENTYL ) 10 MG capsule Take 1 capsule (10 mg total) by mouth 3 (three) times daily before meals.   Glucose Blood (BLOOD GLUCOSE TEST STRIPS) STRP 1 each by In Vitro route in  the morning and at bedtime. May substitute to any manufacturer covered by patient's insurance.   Lancet Device MISC Use to test blood glucose while fasting in the morning and 2 hours largest meal of the day (Patient not taking: Reported on 06/19/2023)   Lancets Misc. MISC Use to test blood glucose while fasting in the morning and 2 hours largest meal of the day (Patient not taking: Reported on 06/19/2023)   [DISCONTINUED] gabapentin  (NEURONTIN ) 100 MG capsule Take 1 capsule (100 mg total) by mouth 3 (three) times daily. (Patient not taking: Reported on 06/19/2023)   [DISCONTINUED] insulin   glargine (LANTUS ) 100 UNIT/ML Solostar Pen Inject 40 Units into the skin daily. (Patient not taking: Reported on 06/19/2023)   [DISCONTINUED] Insulin  Pen Needle 31G X 8 MM MISC 1 Needle by Does not apply route in the morning and at bedtime. (Patient not taking: Reported on 06/19/2023)   [DISCONTINUED] lisinopril  (ZESTRIL ) 5 MG tablet Take 1 tablet (5 mg total) by mouth daily. (Patient not taking: Reported on 06/19/2023)   [DISCONTINUED] metFORMIN  (GLUCOPHAGE -XR) 750 MG 24 hr tablet Take 1 tablet by mouth once daily with breakfast (Patient not taking: Reported on 06/19/2023)   [DISCONTINUED] OZEMPIC , 0.25 OR 0.5 MG/DOSE, 2 MG/3ML SOPN INJECT 0.25MG  SUBCUTANEOUSLY ONCE WEEKLY (Patient not taking: Reported on 06/19/2023)   No facility-administered medications prior to visit.    Review of Systems  Last CBC Lab Results  Component Value Date   WBC 7.9 11/24/2022   HGB 16.0 11/24/2022   HCT 46.5 11/24/2022   MCV 80 11/24/2022   MCH 27.4 11/24/2022   RDW 14.7 11/24/2022   PLT 287 08/17/2022   Last metabolic panel Lab Results  Component Value Date   GLUCOSE 168 (H) 11/24/2022   NA 135 11/24/2022   K 4.5 11/24/2022   CL 94 (L) 11/24/2022   CO2 23 11/24/2022   BUN 13 11/24/2022   CREATININE 0.67 (L) 11/24/2022   EGFR 134 11/24/2022   CALCIUM 10.0 11/24/2022   PROT 7.8 11/24/2022   ALBUMIN 5.0 11/24/2022   LABGLOB 2.4 08/04/2022   AGRATIO 1.8 08/04/2022   BILITOT 0.3 11/24/2022   ALKPHOS 71 11/24/2022   AST 38 11/24/2022   ALT 67 (H) 11/24/2022   ANIONGAP 8 08/17/2022   Last lipids No results found for: CHOL, HDL, LDLCALC, LDLDIRECT, TRIG, CHOLHDL  Last hemoglobin A1c Lab Results  Component Value Date   HGBA1C 8.8 (A) 11/24/2022   Last thyroid  functions Lab Results  Component Value Date   TSH 3.932 09/09/2018        Objective    BP (!) 137/93 (BP Location: Right Arm, Patient Position: Sitting, Cuff Size: Normal)   Pulse 93   Ht 5' 10.5 (1.791 m)   Wt 222 lb  3.2 oz (100.8 kg)   BMI 31.43 kg/m  BP Readings from Last 3 Encounters:  06/19/23 (!) 137/93  01/28/23 132/85  11/24/22 (!) 135/92   Wt Readings from Last 3 Encounters:  06/19/23 222 lb 3.2 oz (100.8 kg)  01/28/23 228 lb 11.2 oz (103.7 kg)  11/24/22 225 lb 3.2 oz (102.2 kg)        Physical Exam  General: Alert, no acute distress Cardio: Normal S1 and S2, RRR, no r/m/g Pulm: CTAB, normal work of breathing   No results found for any visits on 06/19/23.  Assessment & Plan     Problem List Items Addressed This Visit       Cardiovascular and Mediastinum   Hypertension associated with diabetes (HCC) -  Primary   Chronic, elevated today, patient has not been on meds for a while due to insurance coverage   Continue lisinopril  5mg , refills sent today Goal less than 130/80      Relevant Medications   lisinopril  (ZESTRIL ) 5 MG tablet   metFORMIN  (GLUCOPHAGE -XR) 750 MG 24 hr tablet   liraglutide  (VICTOZA ) 18 MG/3ML SOPN   Other Relevant Orders   CMP14+EGFR     Digestive   GERD (gastroesophageal reflux disease)   Chronic Stable  Continue omeprazole  20mg  daily         Endocrine   RESOLVED: Type 2 diabetes mellitus with hyperglycemia, with long-term current use of insulin  (HCC)   Relevant Medications   lisinopril  (ZESTRIL ) 5 MG tablet   metFORMIN  (GLUCOPHAGE -XR) 750 MG 24 hr tablet   Insulin  Pen Needle 31G X 8 MM MISC   gabapentin  (NEURONTIN ) 100 MG capsule   liraglutide  (VICTOZA ) 18 MG/3ML SOPN   Other Relevant Orders   HgB A1c   Type 2 diabetes mellitus with diabetic polyneuropathy, with long-term current use of insulin  (HCC)   -Chronic, not yet at goal  -continue Lantus  40 units daily. -Continue Ozempic  0.5mg  weekly.refill sent  Received message from pharmacy that ozempic  would not be covered so liraglutide  0.6 mg daily was prescribed   -Check A1C  - metformin  750mg  daily  - referred to endocrinology previously, was not able to attend appt   - Gabapentin   100mg  at bedtime for neuropathy symptoms       Relevant Medications   lisinopril  (ZESTRIL ) 5 MG tablet   metFORMIN  (GLUCOPHAGE -XR) 750 MG 24 hr tablet   Insulin  Pen Needle 31G X 8 MM MISC   gabapentin  (NEURONTIN ) 100 MG capsule   liraglutide  (VICTOZA ) 18 MG/3ML SOPN   Other Relevant Orders   HgB A1c   Other Visit Diagnoses       Immunization due       Relevant Orders   Pneumococcal conjugate vaccine 20-valent (Completed)            Return in about 3 months (around 09/17/2023).         Rockie Agent, MD  [facility] [phone] (phone) [phone] (fax)  [facility]

## 2023-06-19 NOTE — Assessment & Plan Note (Addendum)
-  Chronic, not yet at goal  -continue Lantus  40 units daily. -Continue Ozempic  0.5mg  weekly.refill sent  Received message from pharmacy that ozempic  would not be covered so liraglutide  0.6 mg daily was prescribed   -Check A1C  - metformin  750mg  daily  - referred to endocrinology previously, was not able to attend appt   - Gabapentin  100mg  at bedtime for neuropathy symptoms

## 2023-06-19 NOTE — Assessment & Plan Note (Addendum)
 Chronic, elevated today, patient has not been on meds for a while due to insurance coverage   Continue lisinopril 5mg , refills sent today Goal less than 130/80

## 2023-06-19 NOTE — Addendum Note (Signed)
 Addended by: Bing Neighbors on: 06/19/2023 10:46 AM   Modules accepted: Orders

## 2023-06-20 LAB — CMP14+EGFR
ALT: 46 [IU]/L — ABNORMAL HIGH (ref 0–44)
AST: 18 [IU]/L (ref 0–40)
Albumin: 4.4 g/dL (ref 4.3–5.2)
Alkaline Phosphatase: 80 [IU]/L (ref 44–121)
BUN/Creatinine Ratio: 19 (ref 9–20)
BUN: 14 mg/dL (ref 6–20)
Bilirubin Total: 0.5 mg/dL (ref 0.0–1.2)
CO2: 20 mmol/L (ref 20–29)
Calcium: 9.9 mg/dL (ref 8.7–10.2)
Chloride: 93 mmol/L — ABNORMAL LOW (ref 96–106)
Creatinine, Ser: 0.74 mg/dL — ABNORMAL LOW (ref 0.76–1.27)
Globulin, Total: 2.8 g/dL (ref 1.5–4.5)
Glucose: 277 mg/dL — ABNORMAL HIGH (ref 70–99)
Potassium: 4.2 mmol/L (ref 3.5–5.2)
Sodium: 132 mmol/L — ABNORMAL LOW (ref 134–144)
Total Protein: 7.2 g/dL (ref 6.0–8.5)
eGFR: 130 mL/min/{1.73_m2} (ref >59)

## 2023-06-20 LAB — HEMOGLOBIN A1C
Est. average glucose Bld gHb Est-mCnc: 260 mg/dL
Hgb A1c MFr Bld: 10.7 % — ABNORMAL HIGH (ref 4.8–5.6)

## 2023-08-31 ENCOUNTER — Other Ambulatory Visit: Payer: Self-pay | Admitting: Family Medicine

## 2023-08-31 DIAGNOSIS — E1142 Type 2 diabetes mellitus with diabetic polyneuropathy: Secondary | ICD-10-CM

## 2023-08-31 DIAGNOSIS — E1165 Type 2 diabetes mellitus with hyperglycemia: Secondary | ICD-10-CM

## 2023-09-01 NOTE — Telephone Encounter (Signed)
 Requested Prescriptions  Pending Prescriptions Disp Refills   liraglutide (VICTOZA) 18 MG/3ML SOPN [Pharmacy Med Name: LIRAGLUTIDE 3-PAK 18 MG/3 ML] 3 mL 0    Sig: INJECT 0.6 MG UNDER THE SKIN ONCE DAILY     Endocrinology:  Diabetes - GLP-1 Receptor Agonists Failed - 09/01/2023 10:54 AM      Failed - HBA1C is between 0 and 7.9 and within 180 days    Hgb A1c MFr Bld  Date Value Ref Range Status  06/19/2023 10.7 (H) 4.8 - 5.6 % Final    Comment:             Prediabetes: 5.7 - 6.4          Diabetes: >6.4          Glycemic control for adults with diabetes: <7.0          Passed - Valid encounter within last 6 months    Recent Outpatient Visits           2 months ago Hypertension associated with diabetes (HCC)   [place] [facility] Simmons-Robinson, Financial controller, MD   7 months ago Type 2 diabetes mellitus with diabetic polyneuropathy, with long-term current use of insulin (HCC)   [place] [facility] Simmons-Robinson, Mart, MD   9 months ago Type 2 diabetes mellitus with hyperglycemia, with long-term current use of insulin (HCC)   [place] [facility] Simmons-Robinson, [place], MD   10 months ago Type 2 diabetes mellitus with hyperglycemia, with long-term current use of insulin (HCC)   [place] [facility] Simmons-Robinson, [place], MD   11 months ago Type 2 diabetes mellitus with hyperglycemia, with long-term current use of insulin (HCC)    [facility] Simmons-Robinson, [place], MD       Future Appointments             In 3 weeks Simmons-Robinson, Tawanna Cooler, MD [facility]

## 2023-09-25 ENCOUNTER — Ambulatory Visit: Payer: Self-pay | Admitting: Family Medicine

## 2023-09-25 NOTE — Progress Notes (Deleted)
 Established patient visit   Patient: Aaron Parker   DOB: 01/10/2001   23 y.o. Male  MRN: [id number] Visit Date: 09/25/2023  Today's healthcare provider: Mimi Alt, MD   No chief complaint on file.  Subjective       Discussed the use of AI scribe software for clinical note transcription with the patient, who gave verbal consent to proceed.  History of Present Illness      Past Medical History:  Diagnosis Date   Chronic abdominal pain    Diabetes (HCC)    Eczema    GERD (gastroesophageal reflux disease)    Muscle strain    Wolff-Parkinson-White (WPW) syndrome     Medications: Outpatient Medications Prior to Visit  Medication Sig   Blood Glucose Monitoring Suppl (ONE TOUCH ULTRA 2) w/Device KIT USE TO TEST BLOOD SUGAR IN THE MORNING, AT NOON, AND AT BEDTIME   dicyclomine  (BENTYL ) 10 MG capsule Take 1 capsule (10 mg total) by mouth 3 (three) times daily before meals.   gabapentin  (NEURONTIN ) 100 MG capsule Take 1 capsule (100 mg total) by mouth 3 (three) times daily.   Glucose Blood (BLOOD GLUCOSE TEST STRIPS) STRP 1 each by In Vitro route in the morning and at bedtime. May substitute to any manufacturer covered by patient's insurance.   insulin  degludec (TRESIBA FLEXTOUCH) 100 UNIT/ML FlexTouch Pen Inject 40 Units into the skin daily.   Insulin  Pen Needle 31G X 8 MM MISC 1 Needle by Does not apply route in the morning and at bedtime.   Lancet Device MISC Use to test blood glucose while fasting in the morning and 2 hours largest meal of the day (Patient not taking: Reported on 06/19/2023)   Lancets Misc. MISC Use to test blood glucose while fasting in the morning and 2 hours largest meal of the day (Patient not taking: Reported on 06/19/2023)   liraglutide  (VICTOZA ) 18 MG/3ML SOPN INJECT 0.6 MG UNDER THE SKIN ONCE DAILY   lisinopril  (ZESTRIL ) 5 MG tablet Take 1 tablet (5 mg total) by mouth daily.   metFORMIN  (GLUCOPHAGE -XR) 750 MG 24 hr tablet Take 1  tablet (750 mg total) by mouth daily with breakfast.   omeprazole  (PRILOSEC) 20 MG capsule Take 1 capsule (20 mg total) by mouth daily.   No facility-administered medications prior to visit.    Review of Systems  Last metabolic panel Lab Results  Component Value Date   GLUCOSE 277 (H) 06/19/2023   NA 132 (L) 06/19/2023   K 4.2 06/19/2023   CL 93 (L) 06/19/2023   CO2 20 06/19/2023   BUN 14 06/19/2023   CREATININE 0.74 (L) 06/19/2023   EGFR 130 06/19/2023   CALCIUM 9.9 06/19/2023   PROT 7.2 06/19/2023   ALBUMIN 4.4 06/19/2023   LABGLOB 2.8 06/19/2023   AGRATIO 1.8 08/04/2022   BILITOT 0.5 06/19/2023   ALKPHOS 80 06/19/2023   AST 18 06/19/2023   ALT 46 (H) 06/19/2023   ANIONGAP 8 08/17/2022   Last lipids No results found for: "CHOL", "HDL", "LDLCALC", "LDLDIRECT", "TRIG", "CHOLHDL" Last hemoglobin A1c Lab Results  Component Value Date   HGBA1C 10.7 (H) 06/19/2023   Last thyroid  functions Lab Results  Component Value Date   TSH 3.932 09/09/2018     {See past labs  Heme  Chem  Endocrine  Serology  Results Review (optional):1}   Objective    There were no vitals taken for this visit. BP Readings from Last 3 Encounters:  06/19/23 (!) 137/93  01/28/23 132/85  11/24/22 (!) 135/92   Wt Readings from Last 3 Encounters:  06/19/23 222 lb 3.2 oz (100.8 kg)  01/28/23 228 lb 11.2 oz (103.7 kg)  11/24/22 225 lb 3.2 oz (102.2 kg)    {See vitals history (optional):1}    Physical Exam  ***  No results found for any visits on 09/25/23.  Assessment & Plan     Problem List Items Addressed This Visit   None   Assessment & Plan      No follow-ups on file.         Mimi Alt, MD  [facility] [phone] (phone) [phone] (fax)  [facility]

## 2024-01-21 ENCOUNTER — Telehealth: Payer: Self-pay | Admitting: Physician Assistant

## 2024-01-21 DIAGNOSIS — R112 Nausea with vomiting, unspecified: Secondary | ICD-10-CM

## 2024-01-21 DIAGNOSIS — R42 Dizziness and giddiness: Secondary | ICD-10-CM

## 2024-01-21 NOTE — Progress Notes (Signed)

## 2024-01-21 NOTE — Progress Notes (Signed)
 Messaged patient, awaiting response.

## 2024-04-06 NOTE — Progress Notes (Signed)
 Mychart message also sent on 10/17/

## 2024-05-15 ENCOUNTER — Encounter: Payer: Self-pay | Admitting: Emergency Medicine

## 2024-05-15 ENCOUNTER — Other Ambulatory Visit: Payer: Self-pay

## 2024-05-15 ENCOUNTER — Inpatient Hospital Stay
Admission: EM | Admit: 2024-05-15 | Discharge: 2024-05-20 | DRG: 438 | Disposition: A | Payer: Self-pay | Attending: Hospitalist | Admitting: Hospitalist

## 2024-05-15 DIAGNOSIS — R112 Nausea with vomiting, unspecified: Secondary | ICD-10-CM

## 2024-05-15 DIAGNOSIS — K859 Acute pancreatitis without necrosis or infection, unspecified: Secondary | ICD-10-CM | POA: Diagnosis present

## 2024-05-15 DIAGNOSIS — K219 Gastro-esophageal reflux disease without esophagitis: Secondary | ICD-10-CM | POA: Diagnosis present

## 2024-05-15 DIAGNOSIS — F329 Major depressive disorder, single episode, unspecified: Secondary | ICD-10-CM | POA: Diagnosis present

## 2024-05-15 DIAGNOSIS — I152 Hypertension secondary to endocrine disorders: Secondary | ICD-10-CM

## 2024-05-15 DIAGNOSIS — E1142 Type 2 diabetes mellitus with diabetic polyneuropathy: Secondary | ICD-10-CM

## 2024-05-15 DIAGNOSIS — G8929 Other chronic pain: Secondary | ICD-10-CM | POA: Diagnosis present

## 2024-05-15 DIAGNOSIS — R1084 Generalized abdominal pain: Principal | ICD-10-CM

## 2024-05-15 DIAGNOSIS — R739 Hyperglycemia, unspecified: Secondary | ICD-10-CM

## 2024-05-15 DIAGNOSIS — E1165 Type 2 diabetes mellitus with hyperglycemia: Secondary | ICD-10-CM

## 2024-05-15 DIAGNOSIS — E860 Dehydration: Secondary | ICD-10-CM

## 2024-05-15 DIAGNOSIS — I456 Pre-excitation syndrome: Secondary | ICD-10-CM | POA: Insufficient documentation

## 2024-05-15 DIAGNOSIS — E1110 Type 2 diabetes mellitus with ketoacidosis without coma: Secondary | ICD-10-CM | POA: Diagnosis present

## 2024-05-15 LAB — CBC
HCT: 38.8 % — ABNORMAL LOW (ref 39.0–52.0)
Hemoglobin: 14.3 g/dL (ref 13.0–17.0)
MCH: 28.5 pg (ref 26.0–34.0)
MCHC: 36.9 g/dL — ABNORMAL HIGH (ref 30.0–36.0)
MCV: 77.4 fL — ABNORMAL LOW (ref 80.0–100.0)
Platelets: 363 K/uL (ref 150–400)
RBC: 5.01 MIL/uL (ref 4.22–5.81)
RDW: 14.3 % (ref 11.5–15.5)
WBC: 9.8 K/uL (ref 4.0–10.5)
nRBC: 0.7 % — ABNORMAL HIGH (ref 0.0–0.2)

## 2024-05-15 NOTE — ED Provider Notes (Signed)
 [facility] Provider Note    Event Date/Time   First MD Initiated Contact with Patient 05/15/24 2349     (approximate)   History   Abdominal Pain   HPI  Aaron Parker is a 23 y.o. male   Past medical history of type 2 diabetes has been off of his meds for several months due to provider/insurance issues, but just got insurance again and is reestablishing care, who is here for crampy abdominal pain right sided after eating a meal around 4:00 this evening.  Vomited 1 time.  No testicular pain or GU symptoms.  Has, however, been drinking a lot and peeing a lot for the last several months since being off of his insulin  and metformin .  Intermittently checks his blood glucose which typically runs in the 3-400 range.  No other acute medical complaints.  Independent Historian contributed to assessment above: Partner at bedside corroborates information & past medical history above  External Medical Documents Reviewed: Family medicine notes back from January 2025 which document hypertension with a medication lisinopril  5 mg daily, diabetes with metformin , Lantus  40 units daily.      Physical Exam   Triage Vital Signs: ED Triage Vitals  Encounter Vitals Group     BP 05/15/24 2120 (!) 122/99     Girls Systolic BP Percentile --      Girls Diastolic BP Percentile --      Boys Systolic BP Percentile --      Boys Diastolic BP Percentile --      Pulse Rate 05/15/24 2120 (!) 103     Resp 05/15/24 2120 17     Temp 05/15/24 2120 97.6 F (36.4 C)     Temp Source 05/15/24 2120 Oral     SpO2 05/15/24 2120 100 %     Weight 05/15/24 2121 235 lb (106.6 kg)     Height 05/15/24 2121 5' 10.5 (1.791 m)     Head Circumference --      Peak Flow --      Pain Score 05/15/24 2121 8     Pain Loc --      Pain Education --      Exclude from Growth Chart --     Most recent vital signs: Vitals:   05/16/24 0037 05/16/24 0039  BP: (!) 140/88   Pulse: (!) 102   Resp:  20   Temp:  98.7 F (37.1 C)  SpO2: 97%     General: Awake, no distress.  CV:  Good peripheral perfusion.  Resp:  Normal effort.  Abd:  No distention.  Other:  Looks dehydrated with cracked mucous membranes poor skin turgor.  Nonperitoneal abdominal exam but mild tenderness in the right upper and lower quadrants.  Also periumbilical tenderness, mild.   ED Results / Procedures / Treatments   Labs (all labs ordered are listed, but only abnormal results are displayed) Labs Reviewed  CBC - Abnormal; Notable for the following components:      Result Value   HCT 38.8 (*)    MCV 77.4 (*)    MCHC 36.9 (*)    nRBC 0.7 (*)    All other components within normal limits  URINALYSIS, ROUTINE W REFLEX MICROSCOPIC - Abnormal; Notable for the following components:   Color, Urine YELLOW (*)    APPearance CLEAR (*)    Specific Gravity, Urine 1.040 (*)    Glucose, UA >=500 (*)    Ketones, ur 80 (*)    Protein, ur >=  300 (*)    All other components within normal limits  COMPREHENSIVE METABOLIC PANEL WITH GFR - Abnormal; Notable for the following components:   Sodium 131 (*)    Potassium 5.4 (*)    Chloride 94 (*)    CO2 20 (*)    Glucose, Bld 299 (*)    AST 79 (*)    ALT 50 (*)    Anion gap 16 (*)    All other components within normal limits  LIPASE, BLOOD - Abnormal; Notable for the following components:   Lipase 403 (*)    All other components within normal limits  CBG MONITORING, ED - Abnormal; Notable for the following components:   Glucose-Capillary 298 (*)    All other components within normal limits  BASIC METABOLIC PANEL WITH GFR  BASIC METABOLIC PANEL WITH GFR  BASIC METABOLIC PANEL WITH GFR  BASIC METABOLIC PANEL WITH GFR  BASIC METABOLIC PANEL WITH GFR  BETA-HYDROXYBUTYRIC ACID  BETA-HYDROXYBUTYRIC ACID  BETA-HYDROXYBUTYRIC ACID  BETA-HYDROXYBUTYRIC ACID  BETA-HYDROXYBUTYRIC ACID  CBC WITH DIFFERENTIAL/PLATELET  BLOOD GAS, VENOUS  CBG MONITORING, ED     I ordered  and reviewed the above labs they are notable for no leukocytosis.   RADIOLOGY I independently reviewed and interpreted CT abdomen pelvis to see no obvious obstructive or inflammatory changes I also reviewed radiologist's formal read.   PROCEDURES:  Critical Care performed: Yes, see critical care procedure note(s)  .Critical Care  Performed by: Cyrena Mylar, MD Authorized by: Cyrena Mylar, MD   Critical care provider statement:    Critical care time (minutes):  30   Critical care was necessary to treat or prevent imminent or life-threatening deterioration of the following conditions:  Dehydration and endocrine crisis   Critical care was time spent personally by me on the following activities:  Development of treatment plan with patient or surrogate, discussions with consultants, evaluation of patient's response to treatment, examination of patient, ordering and review of laboratory studies, ordering and review of radiographic studies, ordering and performing treatments and interventions, pulse oximetry, re-evaluation of patient's condition and review of old charts    MEDICATIONS ORDERED IN ED: Medications  morphine  (PF) 4 MG/ML injection 4 mg (has no administration in time range)  sodium chloride  0.9 % bolus 1,000 mL (has no administration in time range)  insulin  regular, human (MYXREDLIN ) 100 units/ 100 mL infusion (has no administration in time range)  lactated ringers  infusion (has no administration in time range)  dextrose  5 % in lactated ringers  infusion (has no administration in time range)  dextrose  50 % solution 0-50 mL (has no administration in time range)  lactated ringers  bolus 1,000 mL (has no administration in time range)  sodium chloride  0.9 % bolus 1,000 mL (0 mLs Intravenous Stopped 05/16/24 0149)  metoCLOPramide  (REGLAN ) injection 10 mg (10 mg Intravenous Given 05/16/24 0030)  diphenhydrAMINE  (BENADRYL ) injection 25 mg (25 mg Intravenous Given 05/16/24 0029)  ketorolac   (TORADOL ) 15 MG/ML injection 15 mg (15 mg Intravenous Given 05/16/24 0104)  iohexol  (OMNIPAQUE ) 300 MG/ML solution 100 mL (100 mLs Intravenous Contrast Given 05/16/24 0259)    External physician / consultants:  I spoke with hospital medicine for admission and regarding care plan for this patient.   IMPRESSION / MDM / ASSESSMENT AND PLAN / ED COURSE  I reviewed the triage vital signs and the nursing notes.  Patient's presentation is most consistent with acute presentation with potential threat to life or bodily function.  Differential diagnosis includes, but is not limited to, biliary colic, cholecystitis, pancreatitis, appendicitis, other intra-abdominal infection obstruction, gastroparesis, gastroenteritis, DKA urine infection   The patient is on the cardiac monitor to evaluate for evidence of arrhythmia and/or significant heart rate changes.  MDM:    Broad differential diagnosis may represent a more benign etiology like gastroparesis/gastroenteritis however with right sided tenderness to palpation will check a CT of the abdomen pelvis to rule out more sinister etiologies like a obstruction cholecystitis or appendicitis.  In the meantime we will treat with IV fluids, looks slightly dehydrated likely in the setting of his poorly controlled diabetes, antiemetic.   Ultimately needs close outpatient follow-up and optimization of his chronic problems including diabetes and hypertension.  For now we will refill his lisinopril  insulin  and metformin  and place a PMD referral. --  Unfortunately workup has been delayed due to lab delay.  I called the lab, they were able to give me a verbal report on the metabolic panel that glucose was in the 200s and creatinine under 1.  Asked CT to proceed with imaging given renal function report.  Labs have now resulted showing mild DKA.  CT scan with pancreatitis and lipase elevated corresponding to this diagnosis.  Continues to  have pain.  Admission.       FINAL CLINICAL IMPRESSION(S) / ED DIAGNOSES   Final diagnoses:  Generalized abdominal pain  Nausea and vomiting, unspecified vomiting type  Hyperglycemia  Dehydration  Diabetic ketoacidosis without coma associated with type 2 diabetes mellitus (HCC)  Acute pancreatitis, unspecified complication status, unspecified pancreatitis type     Rx / DC Orders   ED Discharge Orders          Ordered    insulin  degludec (TRESIBA  FLEXTOUCH) 100 UNIT/ML FlexTouch Pen  Daily        05/16/24 0021    Insulin  Pen Needle 31G X 8 MM MISC  2 times daily        05/16/24 0021    Lancet Device MISC        05/16/24 0021    lisinopril  (ZESTRIL ) 5 MG tablet  Daily        05/16/24 0021    metFORMIN  (GLUCOPHAGE -XR) 750 MG 24 hr tablet  Daily with breakfast        05/16/24 0021             Note:  This document was prepared using Dragon voice recognition software and may include unintentional dictation errors.    Cyrena Mylar, MD 05/16/24 [id number]

## 2024-05-15 NOTE — ED Notes (Signed)
 Pt states unable to provide a urine specimen at the current time,

## 2024-05-15 NOTE — ED Triage Notes (Signed)
 Pt from home with c/o mid abd pain since this afternoon, nausea, 1 episode of emesis, denies fever/urinary symptoms/diarrhea

## 2024-05-15 NOTE — ED Provider Notes (Incomplete)
 [facility] Provider Note    Event Date/Time   First MD Initiated Contact with Patient 05/15/24 2349     (approximate)   History   Abdominal Pain   HPI  Aaron Parker is a 23 y.o. male   Past medical history of ***    Independent Historian contributed to assessment above: ***  External Medical Documents Reviewed: ***      Physical Exam   Triage Vital Signs: ED Triage Vitals  Encounter Vitals Group     BP 05/15/24 2120 (!) 122/99     Girls Systolic BP Percentile --      Girls Diastolic BP Percentile --      Boys Systolic BP Percentile --      Boys Diastolic BP Percentile --      Pulse Rate 05/15/24 2120 (!) 103     Resp 05/15/24 2120 17     Temp 05/15/24 2120 97.6 F (36.4 C)     Temp Source 05/15/24 2120 Oral     SpO2 05/15/24 2120 100 %     Weight 05/15/24 2121 235 lb (106.6 kg)     Height 05/15/24 2121 5' 10.5 (1.791 m)     Head Circumference --      Peak Flow --      Pain Score 05/15/24 2121 8     Pain Loc --      Pain Education --      Exclude from Growth Chart --     Most recent vital signs: Vitals:   05/15/24 2120  BP: (!) 122/99  Pulse: (!) 103  Resp: 17  Temp: 97.6 F (36.4 C)  SpO2: 100%    General: Awake, no distress. *** CV:  Good peripheral perfusion. *** Resp:  Normal effort. *** Abd:  No distention. *** Other:  ***   ED Results / Procedures / Treatments   Labs (all labs ordered are listed, but only abnormal results are displayed) Labs Reviewed  CBC - Abnormal; Notable for the following components:      Result Value   HCT 38.8 (*)    MCV 77.4 (*)    MCHC 36.9 (*)    nRBC 0.7 (*)    All other components within normal limits  URINALYSIS, ROUTINE W REFLEX MICROSCOPIC  COMPREHENSIVE METABOLIC PANEL WITH GFR  LIPASE, BLOOD     I ordered and reviewed the above labs they are notable for ***  EKG  ED ECG REPORT I, Ginnie Shams, the attending physician, personally viewed and interpreted this  ECG.   Date: 05/15/2024  EKG Time: ***  Rate: ***  Rhythm: {ekg findings:315101}  Axis: ***  Intervals:{conduction defects:17367}  ST&T Change: ***    RADIOLOGY I independently reviewed and interpreted *** I also reviewed radiologist's formal read.   PROCEDURES:  Critical Care performed: {CriticalCareYesNo:19197::Yes, see critical care procedure note(s),No}  Procedures   MEDICATIONS ORDERED IN ED: Medications - No data to display  External physician / consultants:  I spoke with *** regarding care plan for this patient.   IMPRESSION / MDM / ASSESSMENT AND PLAN / ED COURSE  I reviewed the triage vital signs and the nursing notes.                                Patient's presentation is most consistent with {EM COPA:27473}  Differential diagnosis includes, but is not limited to, ***   ***The patient is on  the cardiac monitor to evaluate for evidence of arrhythmia and/or significant heart rate changes.  MDM:  ***  I considered hospitalization for admission or observation ***        FINAL CLINICAL IMPRESSION(S) / ED DIAGNOSES   Final diagnoses:  None     Rx / DC Orders   ED Discharge Orders     None        Note:  This document was prepared using Dragon voice recognition software and may include unintentional dictation errors.

## 2024-05-16 ENCOUNTER — Other Ambulatory Visit (HOSPITAL_COMMUNITY): Payer: Self-pay

## 2024-05-16 ENCOUNTER — Emergency Department: Payer: Self-pay

## 2024-05-16 ENCOUNTER — Encounter: Payer: Self-pay | Admitting: Hospitalist

## 2024-05-16 DIAGNOSIS — K859 Acute pancreatitis without necrosis or infection, unspecified: Secondary | ICD-10-CM | POA: Diagnosis present

## 2024-05-16 DIAGNOSIS — E0810 Diabetes mellitus due to underlying condition with ketoacidosis without coma: Secondary | ICD-10-CM

## 2024-05-16 DIAGNOSIS — R739 Hyperglycemia, unspecified: Secondary | ICD-10-CM

## 2024-05-16 DIAGNOSIS — E1110 Type 2 diabetes mellitus with ketoacidosis without coma: Secondary | ICD-10-CM | POA: Diagnosis present

## 2024-05-16 LAB — BASIC METABOLIC PANEL WITH GFR
Anion gap: 22 — ABNORMAL HIGH (ref 5–15)
Anion gap: 13 (ref 5–15)
Anion gap: 18 — ABNORMAL HIGH (ref 5–15)
BUN: 14 mg/dL (ref 6–20)
BUN: 12 mg/dL (ref 6–20)
BUN: 7 mg/dL (ref 6–20)
CO2: 12 mmol/L — ABNORMAL LOW (ref 22–32)
CO2: 21 mmol/L — ABNORMAL LOW (ref 22–32)
CO2: 20 mmol/L — ABNORMAL LOW (ref 22–32)
Calcium: 8.4 mg/dL — ABNORMAL LOW (ref 8.9–10.3)
Calcium: 8.9 mg/dL (ref 8.9–10.3)
Calcium: 9.2 mg/dL (ref 8.9–10.3)
Chloride: 96 mmol/L — ABNORMAL LOW (ref 98–111)
Chloride: 96 mmol/L — ABNORMAL LOW (ref 98–111)
Chloride: 94 mmol/L — ABNORMAL LOW (ref 98–111)
Creatinine, Ser: 0.64 mg/dL (ref 0.61–1.24)
Creatinine, Ser: 0.79 mg/dL (ref 0.61–1.24)
Creatinine, Ser: 0.56 mg/dL — ABNORMAL LOW (ref 0.61–1.24)
GFR, Estimated: >60 mL/min (ref >60)
GFR, Estimated: >60 mL/min (ref >60)
GFR, Estimated: >60 mL/min (ref >60)
Glucose, Bld: 250 mg/dL — ABNORMAL HIGH (ref 70–99)
Glucose, Bld: 236 mg/dL — ABNORMAL HIGH (ref 70–99)
Glucose, Bld: 204 mg/dL — ABNORMAL HIGH (ref 70–99)
Potassium: 4.1 mmol/L (ref 3.5–5.1)
Potassium: 4 mmol/L (ref 3.5–5.1)
Potassium: 3.5 mmol/L (ref 3.5–5.1)
Sodium: 130 mmol/L — ABNORMAL LOW (ref 135–145)
Sodium: 130 mmol/L — ABNORMAL LOW (ref 135–145)
Sodium: 131 mmol/L — ABNORMAL LOW (ref 135–145)

## 2024-05-16 LAB — COMPREHENSIVE METABOLIC PANEL WITH GFR
ALT: 50 U/L — ABNORMAL HIGH (ref 0–44)
ALT: 35 U/L (ref 0–44)
AST: 79 U/L — ABNORMAL HIGH (ref 15–41)
AST: 18 U/L (ref 15–41)
Albumin: 4.3 g/dL (ref 3.5–5.0)
Albumin: 4.1 g/dL (ref 3.5–5.0)
Alkaline Phosphatase: 53 U/L (ref 38–126)
Alkaline Phosphatase: 57 U/L (ref 38–126)
Anion gap: 16 — ABNORMAL HIGH (ref 5–15)
Anion gap: 13 (ref 5–15)
BUN: 14 mg/dL (ref 6–20)
BUN: 6 mg/dL (ref 6–20)
CO2: 20 mmol/L — ABNORMAL LOW (ref 22–32)
CO2: 25 mmol/L (ref 22–32)
Calcium: 9.5 mg/dL (ref 8.9–10.3)
Calcium: 9.4 mg/dL (ref 8.9–10.3)
Chloride: 94 mmol/L — ABNORMAL LOW (ref 98–111)
Chloride: 95 mmol/L — ABNORMAL LOW (ref 98–111)
Creatinine, Ser: 0.82 mg/dL (ref 0.61–1.24)
Creatinine, Ser: 0.56 mg/dL — ABNORMAL LOW (ref 0.61–1.24)
GFR, Estimated: >60 mL/min (ref >60)
GFR, Estimated: >60 mL/min (ref >60)
Glucose, Bld: 299 mg/dL — ABNORMAL HIGH (ref 70–99)
Glucose, Bld: 141 mg/dL — ABNORMAL HIGH (ref 70–99)
Potassium: 5.4 mmol/L — ABNORMAL HIGH (ref 3.5–5.1)
Potassium: 3.3 mmol/L — ABNORMAL LOW (ref 3.5–5.1)
Sodium: 131 mmol/L — ABNORMAL LOW (ref 135–145)
Sodium: 133 mmol/L — ABNORMAL LOW (ref 135–145)
Total Bilirubin: 0.7 mg/dL (ref 0.0–1.2)
Total Bilirubin: 0.8 mg/dL (ref 0.0–1.2)
Total Protein: 7.6 g/dL (ref 6.5–8.1)
Total Protein: 6.7 g/dL (ref 6.5–8.1)

## 2024-05-16 LAB — GLUCOSE, CAPILLARY
Glucose-Capillary: 103 mg/dL — ABNORMAL HIGH (ref 70–99)
Glucose-Capillary: 121 mg/dL — ABNORMAL HIGH (ref 70–99)
Glucose-Capillary: 133 mg/dL — ABNORMAL HIGH (ref 70–99)
Glucose-Capillary: 162 mg/dL — ABNORMAL HIGH (ref 70–99)
Glucose-Capillary: 207 mg/dL — ABNORMAL HIGH (ref 70–99)
Glucose-Capillary: 225 mg/dL — ABNORMAL HIGH (ref 70–99)
Glucose-Capillary: 227 mg/dL — ABNORMAL HIGH (ref 70–99)

## 2024-05-16 LAB — CBC WITH DIFFERENTIAL/PLATELET
Abs Immature Granulocytes: 0.03 K/uL (ref 0.00–0.07)
Abs Immature Granulocytes: 0.04 K/uL (ref 0.00–0.07)
Basophils Absolute: 0.1 K/uL (ref 0.0–0.1)
Basophils Absolute: 0.1 K/uL (ref 0.0–0.1)
Basophils Relative: 1 %
Basophils Relative: 1 %
Eosinophils Absolute: 0.1 K/uL (ref 0.0–0.5)
Eosinophils Absolute: 0 K/uL (ref 0.0–0.5)
Eosinophils Relative: 1 %
Eosinophils Relative: 0 %
HCT: 37 % — ABNORMAL LOW (ref 39.0–52.0)
HCT: 39.4 % (ref 39.0–52.0)
Hemoglobin: 13.6 g/dL (ref 13.0–17.0)
Hemoglobin: 13.8 g/dL (ref 13.0–17.0)
Immature Granulocytes: 0 %
Immature Granulocytes: 0 %
Lymphocytes Relative: 26 %
Lymphocytes Relative: 17 %
Lymphs Abs: 2.4 K/uL (ref 0.7–4.0)
Lymphs Abs: 1.6 K/uL (ref 0.7–4.0)
MCH: 29.5 pg (ref 26.0–34.0)
MCH: 27.1 pg (ref 26.0–34.0)
MCHC: 36.8 g/dL — ABNORMAL HIGH (ref 30.0–36.0)
MCHC: 35 g/dL (ref 30.0–36.0)
MCV: 80.3 fL (ref 80.0–100.0)
MCV: 77.3 fL — ABNORMAL LOW (ref 80.0–100.0)
Monocytes Absolute: 0.5 K/uL (ref 0.1–1.0)
Monocytes Absolute: 0.8 K/uL (ref 0.1–1.0)
Monocytes Relative: 6 %
Monocytes Relative: 8 %
Neutro Abs: 6.1 K/uL (ref 1.7–7.7)
Neutro Abs: 6.8 K/uL (ref 1.7–7.7)
Neutrophils Relative %: 66 %
Neutrophils Relative %: 74 %
Platelets: 281 K/uL (ref 150–400)
Platelets: 215 K/uL (ref 150–400)
RBC: 4.61 MIL/uL (ref 4.22–5.81)
RBC: 5.1 MIL/uL (ref 4.22–5.81)
RDW: 14.3 % (ref 11.5–15.5)
RDW: 13.6 % (ref 11.5–15.5)
WBC: 9.2 K/uL (ref 4.0–10.5)
WBC: 9.3 K/uL (ref 4.0–10.5)
nRBC: 0 % (ref 0.0–0.2)
nRBC: 0 % (ref 0.0–0.2)

## 2024-05-16 LAB — URINALYSIS, ROUTINE W REFLEX MICROSCOPIC
Bacteria, UA: NONE SEEN
Bilirubin Urine: NEGATIVE
Glucose, UA: >=500 mg/dL — AB
Hgb urine dipstick: NEGATIVE
Ketones, ur: 80 mg/dL — AB
Leukocytes,Ua: NEGATIVE
Nitrite: NEGATIVE
Protein, ur: >=300 mg/dL — AB
Specific Gravity, Urine: 1.04 — ABNORMAL HIGH (ref 1.005–1.030)
Squamous Epithelial / HPF: 0 /HPF (ref 0–5)
pH: 6 (ref 5.0–8.0)

## 2024-05-16 LAB — CBG MONITORING, ED
Glucose-Capillary: 298 mg/dL — ABNORMAL HIGH (ref 70–99)
Glucose-Capillary: 243 mg/dL — ABNORMAL HIGH (ref 70–99)
Glucose-Capillary: 292 mg/dL — ABNORMAL HIGH (ref 70–99)
Glucose-Capillary: 211 mg/dL — ABNORMAL HIGH (ref 70–99)
Glucose-Capillary: 212 mg/dL — ABNORMAL HIGH (ref 70–99)
Glucose-Capillary: 201 mg/dL — ABNORMAL HIGH (ref 70–99)
Glucose-Capillary: 172 mg/dL — ABNORMAL HIGH (ref 70–99)
Glucose-Capillary: 260 mg/dL — ABNORMAL HIGH (ref 70–99)

## 2024-05-16 LAB — BLOOD GAS, VENOUS
Acid-Base Excess: 2.2 mmol/L — ABNORMAL HIGH (ref 0.0–2.0)
Bicarbonate: 27.8 mmol/L (ref 20.0–28.0)
O2 Saturation: 67.4 %
Patient temperature: 37
pCO2, Ven: 46 mmHg (ref 44–60)
pH, Ven: 7.39 (ref 7.25–7.43)
pO2, Ven: 38 mmHg (ref 32–45)

## 2024-05-16 LAB — BETA-HYDROXYBUTYRIC ACID
Beta-Hydroxybutyric Acid: 0.86 mmol/L — ABNORMAL HIGH (ref 0.05–0.27)
Beta-Hydroxybutyric Acid: 0.57 mmol/L — ABNORMAL HIGH (ref 0.05–0.27)
Beta-Hydroxybutyric Acid: 0.57 mmol/L — ABNORMAL HIGH (ref 0.05–0.27)

## 2024-05-16 LAB — TRIGLYCERIDES: Triglycerides: 1523 mg/dL — ABNORMAL HIGH (ref <150)

## 2024-05-16 LAB — LIPID PANEL
Cholesterol: 301 mg/dL — ABNORMAL HIGH (ref 0–200)
HDL: 17 mg/dL — ABNORMAL LOW (ref >40)
LDL Cholesterol: 122 mg/dL (ref 0–99)
Total CHOL/HDL Ratio: 17.5 ratio
Triglycerides: 2027 mg/dL — ABNORMAL HIGH (ref <150)
VLDL: 405 mg/dL — ABNORMAL HIGH (ref 0–40)

## 2024-05-16 LAB — PROCALCITONIN: Procalcitonin: <0.1 ng/mL

## 2024-05-16 LAB — MRSA NEXT GEN BY PCR, NASAL: MRSA by PCR Next Gen: NOT DETECTED

## 2024-05-16 LAB — LIPASE, BLOOD: Lipase: 403 U/L — ABNORMAL HIGH (ref 11–51)

## 2024-05-16 LAB — TROPONIN T, HIGH SENSITIVITY
Troponin T High Sensitivity: <15 ng/L (ref 0–19)
Troponin T High Sensitivity: <15 ng/L (ref 0–19)

## 2024-05-16 MED ORDER — POTASSIUM CHLORIDE 10 MEQ/100ML IV SOLN
10.0000 meq | INTRAVENOUS | Status: AC
  Administered 2024-05-17 (×3): 10 meq via INTRAVENOUS
  Filled 2024-05-16 (×3): qty 100

## 2024-05-16 MED ORDER — LACTATED RINGERS IV BOLUS
1000.0000 mL | INTRAVENOUS | Status: AC
  Administered 2024-05-16: 1000 mL via INTRAVENOUS

## 2024-05-16 MED ORDER — POLYETHYLENE GLYCOL 3350 17 G PO PACK: 17.0000 g | PACK | Freq: Every day | ORAL | Status: AC | PRN

## 2024-05-16 MED ORDER — DIPHENHYDRAMINE HCL 50 MG/ML IJ SOLN
25.0000 mg | Freq: Once | INTRAMUSCULAR | Status: AC
  Administered 2024-05-16: 25 mg via INTRAVENOUS
  Filled 2024-05-16: qty 1

## 2024-05-16 MED ORDER — DEXTROSE IN LACTATED RINGERS 5 % IV SOLN: INTRAVENOUS | Status: DC

## 2024-05-16 MED ORDER — INSULIN GLARGINE 100 UNIT/ML ~~LOC~~ SOLN
20.0000 [IU] | Freq: Every day | SUBCUTANEOUS | Status: DC
  Administered 2024-05-16: 20 [IU] via SUBCUTANEOUS
  Filled 2024-05-16: qty 0.2

## 2024-05-16 MED ORDER — METFORMIN HCL ER 750 MG PO TB24: 750.0000 mg | ORAL_TABLET | Freq: Every day | ORAL | 3 refills | Status: DC

## 2024-05-16 MED ORDER — INSULIN (MYXREDLIN) INFUSION FOR HYPERTRIGLYCERIDEMIA
0.1000 [IU]/kg/h | INTRAVENOUS | Status: DC
  Administered 2024-05-16 – 2024-05-17 (×4): 0.1 [IU]/kg/h via INTRAVENOUS
  Filled 2024-05-16 (×6): qty 100

## 2024-05-16 MED ORDER — KETOROLAC TROMETHAMINE 15 MG/ML IJ SOLN
15.0000 mg | Freq: Three times a day (TID) | INTRAMUSCULAR | Status: DC | PRN
  Administered 2024-05-16: 15 mg via INTRAVENOUS
  Filled 2024-05-16: qty 1

## 2024-05-16 MED ORDER — LANCET DEVICE MISC: 6 refills | Status: AC

## 2024-05-16 MED ORDER — ONDANSETRON HCL 4 MG/2ML IJ SOLN: 4.0000 mg | Freq: Four times a day (QID) | INTRAMUSCULAR | Status: DC | PRN

## 2024-05-16 MED ORDER — LISINOPRIL 5 MG PO TABS: 5.0000 mg | ORAL_TABLET | Freq: Every day | ORAL | 3 refills | Status: AC

## 2024-05-16 MED ORDER — DEXTROSE 50 % IV SOLN: 0.0000 mL | INTRAVENOUS | Status: DC | PRN

## 2024-05-16 MED ORDER — ACETAMINOPHEN 325 MG PO TABS
650.0000 mg | ORAL_TABLET | Freq: Four times a day (QID) | ORAL | Status: DC | PRN
  Administered 2024-05-17 – 2024-05-18 (×3): 650 mg via ORAL
  Filled 2024-05-16 (×3): qty 2

## 2024-05-16 MED ORDER — SENNOSIDES-DOCUSATE SODIUM 8.6-50 MG PO TABS
1.0000 | ORAL_TABLET | Freq: Two times a day (BID) | ORAL | Status: DC
  Administered 2024-05-16: 1 via ORAL
  Filled 2024-05-16 (×6): qty 1

## 2024-05-16 MED ORDER — KETOROLAC TROMETHAMINE 15 MG/ML IJ SOLN
15.0000 mg | Freq: Once | INTRAMUSCULAR | Status: AC
  Administered 2024-05-16: 15 mg via INTRAVENOUS
  Filled 2024-05-16: qty 1

## 2024-05-16 MED ORDER — POTASSIUM CHLORIDE CRYS ER 20 MEQ PO TBCR
40.0000 meq | EXTENDED_RELEASE_TABLET | Freq: Once | ORAL | Status: AC
  Administered 2024-05-16: 40 meq via ORAL
  Filled 2024-05-16: qty 2

## 2024-05-16 MED ORDER — INSULIN REGULAR(HUMAN) IN NACL 100-0.9 UT/100ML-% IV SOLN
INTRAVENOUS | Status: DC
  Administered 2024-05-16: 18 [IU]/h via INTRAVENOUS
  Filled 2024-05-16: qty 100

## 2024-05-16 MED ORDER — INSULIN ASPART 100 UNIT/ML IJ SOLN
0.0000 [IU] | Freq: Three times a day (TID) | INTRAMUSCULAR | Status: DC
  Administered 2024-05-16: 3 [IU] via SUBCUTANEOUS
  Administered 2024-05-16: 8 [IU] via SUBCUTANEOUS
  Filled 2024-05-16: qty 8
  Filled 2024-05-16: qty 3

## 2024-05-16 MED ORDER — INSULIN PEN NEEDLE 31G X 8 MM MISC: 1.0000 | Freq: Two times a day (BID) | 6 refills | Status: DC

## 2024-05-16 MED ORDER — IOHEXOL 300 MG/ML  SOLN
100.0000 mL | Freq: Once | INTRAMUSCULAR | Status: AC | PRN
  Administered 2024-05-16: 100 mL via INTRAVENOUS

## 2024-05-16 MED ORDER — KETOROLAC TROMETHAMINE 15 MG/ML IJ SOLN
15.0000 mg | Freq: Four times a day (QID) | INTRAMUSCULAR | Status: DC | PRN
  Administered 2024-05-17 (×2): 30 mg via INTRAVENOUS
  Administered 2024-05-18: 15 mg via INTRAVENOUS
  Filled 2024-05-16: qty 1
  Filled 2024-05-16 (×2): qty 2

## 2024-05-16 MED ORDER — PANTOPRAZOLE SODIUM 40 MG IV SOLR
40.0000 mg | Freq: Two times a day (BID) | INTRAVENOUS | Status: DC
  Administered 2024-05-16 (×2): 40 mg via INTRAVENOUS
  Filled 2024-05-16 (×2): qty 10

## 2024-05-16 MED ORDER — SODIUM CHLORIDE 0.9 % IV BOLUS
1000.0000 mL | Freq: Once | INTRAVENOUS | Status: AC
  Administered 2024-05-16: 1000 mL via INTRAVENOUS

## 2024-05-16 MED ORDER — TRESIBA FLEXTOUCH 100 UNIT/ML ~~LOC~~ SOPN: 40.0000 [IU] | PEN_INJECTOR | Freq: Every day | SUBCUTANEOUS | 2 refills | Status: DC

## 2024-05-16 MED ORDER — DEXTROSE 5 % IV SOLN: INTRAVENOUS | Status: DC

## 2024-05-16 MED ORDER — MORPHINE SULFATE (PF) 4 MG/ML IV SOLN
4.0000 mg | INTRAVENOUS | Status: DC | PRN
  Administered 2024-05-16: 4 mg via INTRAVENOUS
  Filled 2024-05-16: qty 1

## 2024-05-16 MED ORDER — INSULIN ASPART 100 UNIT/ML IJ SOLN: 0.0000 [IU] | Freq: Every day | INTRAMUSCULAR | Status: DC

## 2024-05-16 MED ORDER — HYDROMORPHONE HCL 1 MG/ML IJ SOLN
1.0000 mg | INTRAMUSCULAR | Status: AC | PRN
  Administered 2024-05-16: 1 mg via INTRAVENOUS
  Filled 2024-05-16: qty 1

## 2024-05-16 MED ORDER — METOCLOPRAMIDE HCL 5 MG/ML IJ SOLN
10.0000 mg | Freq: Once | INTRAMUSCULAR | Status: AC
  Administered 2024-05-16: 10 mg via INTRAVENOUS
  Filled 2024-05-16: qty 2

## 2024-05-16 MED ORDER — LACTATED RINGERS IV SOLN: INTRAVENOUS | Status: DC

## 2024-05-16 NOTE — ED Notes (Signed)
 Notified EDP Cyrena of delay in pt labs and CT delayed d/t labs.

## 2024-05-16 NOTE — H&P (Addendum)
 History and Physical    Aaron Parker FMW:[id number] DOB: Aug 22, 2000 DOA: 05/15/2024  DOS: the patient was seen and examined on 05/15/2024  PCP: Sharma Coyer, MD   Patient coming from: Home  I have personally briefly reviewed patient's old medical records in [facility]  Chief Complaint: Abdominal pain started at 4 PM 12/7  HPI: Aaron Parker is a pleasant 23 y.o. male with medical history significant for type 2 diabetes not on any medications for several months due to insurance issues, chronic abdominal pain, GERD, Wolff-Parkinson-White syndrome not on any medications who came into ED complaining of abdominal pain started 4 PM  yesterday.  Patient is stated that his pain was severe at 9.5/10, left upper quadrant and epigastric region, associated with nausea and 1 episode of vomiting which contained food material.  He denied any hematemesis or melena.  He denied any fever, chills, chest pain, cough, shortness of breath, palpitations.  He denies any trauma to his abdomen.  He denies any alcohol drinking and history of gallbladder stones.  He denies any testicular pain or GU symptoms.  ED Course: Upon arrival to the ED, patient is found to be tachycardic at 103, sodium 131, potassium 5.4, AST 79, ALT 50, anion gap of 16, lipase of 403, glucose 298, beta-hydroxybutyrate was positive.  Patient was started on insulin  drip and IV fluid and pain medications.  Hospitalist service was consulted for evaluation for admission for acute pancreatitis and diabetic ketoacidosis.  Review of Systems:  ROS  All other systems negative except as noted in the HPI.  Past Medical History:  Diagnosis Date   Chronic abdominal pain    Diabetes (HCC)    Eczema    GERD (gastroesophageal reflux disease)    Muscle strain    Wolff-Parkinson-White (WPW) syndrome     Past Surgical History:  Procedure Laterality Date   ADENOIDECTOMY  2007   TONSILLECTOMY       reports that he has never  smoked. He has been exposed to tobacco smoke. He has never used smokeless tobacco. He reports that he does not currently use alcohol. He reports that he does not currently use drugs.  Allergies  Allergen Reactions   Codeine Hives    History reviewed. No pertinent family history.  Prior to Admission medications   Medication Sig Start Date End Date Taking? Authorizing Provider  Blood Glucose Monitoring Suppl (ONE TOUCH ULTRA 2) w/Device KIT USE TO TEST BLOOD SUGAR IN THE MORNING, AT NOON, AND AT BEDTIME 09/04/22   Simmons-Robinson, Makiera, MD  Glucose Blood (BLOOD GLUCOSE TEST STRIPS) STRP 1 each by In Vitro route in the morning and at bedtime. May substitute to any manufacturer covered by patient's insurance. 08/07/22 01/28/23  Simmons-Robinson, Coyer, MD  insulin  degludec (TRESIBA  FLEXTOUCH) 100 UNIT/ML FlexTouch Pen Inject 40 Units into the skin daily. 05/16/24   Cyrena Mylar, MD  Insulin  Pen Needle 31G X 8 MM MISC 1 Needle by Does not apply route in the morning and at bedtime. 05/16/24   Cyrena Mylar, MD  Lancet Device MISC Use to test blood glucose while fasting in the morning and 2 hours largest meal of the day 05/16/24   Cyrena Mylar, MD  Lancets Misc. MISC Use to test blood glucose while fasting in the morning and 2 hours largest meal of the day Patient not taking: Reported on 06/19/2023 08/07/22   Simmons-Robinson, Coyer, MD  liraglutide  (VICTOZA ) 18 MG/3ML SOPN INJECT 0.6 MG UNDER THE SKIN ONCE DAILY 09/01/23  Simmons-Robinson, Makiera, MD  lisinopril  (ZESTRIL ) 5 MG tablet Take 1 tablet (5 mg total) by mouth daily. 05/16/24   Cyrena Mylar, MD  metFORMIN  (GLUCOPHAGE -XR) 750 MG 24 hr tablet Take 1 tablet (750 mg total) by mouth daily with breakfast. 05/16/24   Cyrena Mylar, MD  omeprazole  (PRILOSEC) 20 MG capsule Take 1 capsule (20 mg total) by mouth daily. 11/24/22   [place], PA-C    Physical Exam: Vitals:   05/16/24 0039 05/16/24 0459 05/16/24 0530 05/16/24 0630  BP:  (!) 145/96 121/89    Pulse:  (!) 108 (!) 108   Resp:  18 20   Temp: 98.7 F (37.1 C)   98.3 F (36.8 C)  TempSrc: Oral   Oral  SpO2:  98% 97%   Weight:      Height:        Physical Exam   Constitutional: Alert, awake, calm, comfortable HEENT: Neck supple Respiratory: Clear to auscultation B/L, no wheezing, no rales.  Cardiovascular: Regular rate and rhythm, no murmurs / rubs / gallops. No extremity edema. 2+ pedal pulses. No carotid bruits.  Abdomen: Soft, diffuse moderate tenderness, bowel sounds are present Musculoskeletal: no clubbing / cyanosis. Good ROM, no contractures. Normal muscle tone.  Skin: no rashes, lesions, ulcers. Neurologic: CN 2-12 grossly intact. Sensation intact, No focal deficit identified Psychiatric: Alert and oriented x 3. Normal mood.    Labs on Admission: I have personally reviewed following labs and imaging studies  CBC: Recent Labs  Lab 05/15/24 2125 05/16/24 0522  WBC 9.8 9.2  NEUTROABS  --  6.1  HGB 14.3 13.6  HCT 38.8* 37.0*  MCV 77.4* 80.3  PLT 363 281   Basic Metabolic Panel: Recent Labs  Lab 05/16/24 0044 05/16/24 0522  NA 131* 130*  K 5.4* 4.1  CL 94* 96*  CO2 20* 12*  GLUCOSE 299* 250*  BUN 14 14  CREATININE 0.82 0.64  CALCIUM 9.5 8.4*   GFR: Estimated Creatinine Clearance: 174.1 mL/min (by C-G formula based on SCr of 0.64 mg/dL). Liver Function Tests: Recent Labs  Lab 05/16/24 0044  AST 79*  ALT 50*  ALKPHOS 53  BILITOT 0.7  PROT 7.6  ALBUMIN 4.3   Recent Labs  Lab 05/16/24 0044  LIPASE 403*   No results for input(s): AMMONIA in the last 168 hours. Coagulation Profile: No results for input(s): INR, PROTIME in the last 168 hours. Cardiac Enzymes: No results for input(s): CKTOTAL, CKMB, CKMBINDEX, TROPONINI, TROPONINIHS in the last 168 hours. BNP (last 3 results) No results for input(s): BNP in the last 8760 hours. HbA1C: No results for input(s): HGBA1C in the last 72 hours. CBG: Recent Labs  Lab  05/16/24 0444 05/16/24 0521 05/16/24 0632 05/16/24 0738 05/16/24 0844  GLUCAP 292* 243* 211* 212* 201*   Lipid Profile: No results for input(s): CHOL, HDL, LDLCALC, TRIG, CHOLHDL, LDLDIRECT in the last 72 hours. Thyroid  Function Tests: No results for input(s): TSH, T4TOTAL, FREET4, T3FREE, THYROIDAB in the last 72 hours. Anemia Panel: No results for input(s): VITAMINB12, FOLATE, FERRITIN, TIBC, IRON, RETICCTPCT in the last 72 hours. Urine analysis:    Component Value Date/Time   COLORURINE YELLOW (A) 05/15/2024 2327   APPEARANCEUR CLEAR (A) 05/15/2024 2327   LABSPEC 1.040 (H) 05/15/2024 2327   PHURINE 6.0 05/15/2024 2327   GLUCOSEU >=500 (A) 05/15/2024 2327   HGBUR NEGATIVE 05/15/2024 2327   BILIRUBINUR NEGATIVE 05/15/2024 2327   KETONESUR 80 (A) 05/15/2024 2327   PROTEINUR >=300 (A) 05/15/2024 2327   NITRITE  NEGATIVE 05/15/2024 2327   LEUKOCYTESUR NEGATIVE 05/15/2024 2327    Radiological Exams on Admission: I have personally reviewed images CT ABDOMEN PELVIS W CONTRAST Result Date: 05/16/2024 EXAM: CT ABDOMEN AND PELVIS WITH CONTRAST 12/17/2019 TECHNIQUE: CT of the abdomen and pelvis was performed with the administration of 100 mL of iohexol  (OMNIPAQUE ) 300 MG/ML solution. Multiplanar reformatted images are provided for review. Automated exposure control, iterative reconstruction, and/or weight-based adjustment of the mA/kV was utilized to reduce the radiation dose to as low as reasonably achievable. COMPARISON: Ultrasound abdomen 08/14/2022, CT abdomen and pelvis 12/28/2022 CLINICAL HISTORY: Abdominal pain, acute, nonlocalized; RLQ abdominal pain; R sided abd crampy pain nv after eating. FINDINGS: LOWER CHEST: No acute abnormality. LIVER: Diffusely hypodense hepatic parenchyma relative to the spleen. GALLBLADDER AND BILE DUCTS: Gallbladder is unremarkable. No biliary ductal dilatation. SPLEEN: Enlarged spleen measuring up to 14 cm. PANCREAS:  Pancreatic fat stranding along the pancreatic tail and distal body. ADRENAL GLANDS: No acute abnormality. KIDNEYS, URETERS AND BLADDER: No stones in the kidneys or ureters. No hydronephrosis. No perinephric or periureteral stranding. Urinary bladder is unremarkable. GI AND BOWEL: Stomach demonstrates no acute abnormality. Colonic diverticulosis. No small or large bowel thickening or dilatation. The appendix is unremarkable. PERITONEUM AND RETROPERITONEUM: No ascites. No free air. VASCULATURE: Aorta is normal in caliber. LYMPH NODES: No lymphadenopathy. REPRODUCTIVE ORGANS: Normal prostate. BONES AND SOFT TISSUES: No acute osseous abnormality. No focal soft tissue abnormality. IMPRESSION: 1. Acute pancreatitis. 2. Splenomegaly. 3. Colonic diverticulosis without evidence of diverticulitis. Electronically signed by: Morgane Naveau MD 05/16/2024 03:15 AM EST RP Workstation: [id number]    EKG: NA    Assessment/Plan Principal Problem:   DKA (diabetic ketoacidosis) (HCC) Active Problems:   MDD (major depressive disorder)   GERD (gastroesophageal reflux disease)   Chronic abdominal pain   Pancreatitis, acute    Assessment and Plan: 23 year old male with history of diabetes, chronic abdominal pain, WPW syndrome, GERD who came into ED complaining of abdominal pain started yesterday.  He is does not drink alcohol and gallbladder does not have a stone.  1.  Idiopathic pancreatitis - He will be admitted to the hospital as inpatient in the stepdown unit - Nothing by mouth, IV fluid, pain medications - There is no obvious etiology as his gallbladder does not have a stone, he does not drink alcohol. - Could be related to diabetes itself, hyperlipidemia or simply idiopathic. - Will get lipid panel checked  2.  Diabetic ketoacidosis - Patient does not take medications due to insurance issues. - Patient says he has a new job and likely has insurance but he is not sure - He will be given insulin  drip per  protocol and place him in stepdown unit. - Monitor electrolytes and kidney function per protocol - Extensive counseling was done regarding control of diabetes.  3.  GERD - He will be given Protonix  IV twice daily.  4.  WPW syndrome - Not active and does not have any medication at this point - Continue to monitor entirely  5.  Hyponatremia - This could be pseudohyponatremia due to hyperglycemia - Continue to monitor sodium level  6.  Transaminitis - Mild elevated AST and ALT - May be related to pancreatitis - Continue to monitor  Addendum: Patient's triglycerides is very high at 2027, VLDL 405, HDL 17, cholesterol 307.  Discussed with critical care attending who will help manage this acute pancreatitis due to severe hypertriglyceridemia.  Patient will be moved to ICU for further insulin  drip and management of  this acute pancreatitis.  Patient was moved to ICU for hypertriglyceridemia.  RN called me for ST elevation on the monitor.  EKG was done.  Discussed with Dr. Darron from cardiology.  Dr. Lorelie advised for checking the troponin as patient does not have a chest pain, and EKG does not meet criteria for ST elevation, continue to monitor, check troponins and echocardiogram.  EKG appeared to be similar to 2024 EKG.  No need for heparin drip at this point.  DVT prophylaxis: Lovenox  Code Status: Full Code Family Communication: His girlfriend was at bedside sleeping Disposition Plan: Home Consults called: None Admission status: Inpatient, Step Down Unit   Nena Rebel, MD [facility] 05/16/2024, 9:23 AM

## 2024-05-16 NOTE — Consult Note (Signed)
 NAME:  Aaron Parker, MRN:  [id number], DOB:  02-19-2001, LOS: 0 ADMISSION DATE:  05/15/2024, CONSULTATION DATE:  05/16/2024 REFERRING MD:  Dr. Roann, CHIEF COMPLAINT:  Hypertriglyceridemia, Acute Pancreatitis   Brief Pt Description / Synopsis:  23 y.o. male with PMHx significant for Type II DM admitted with DKA and Acute Pancreatitis due to severe Hypertriglyceridemia requiring insulin  drip.   History of Present Illness:   Aaron Parker is a pleasant 23 y.o. male with medical history significant for type 2 diabetes not on any medications for several months due to insurance issues, chronic abdominal pain, GERD, Wolff-Parkinson-White syndrome not on any medications who came into ED complaining of abdominal pain that started at 4 PM  the day prior to presentation.  Aaron Parker is stated that Aaron Parker pain was severe at 9.5/10, left upper quadrant and epigastric region, associated with nausea and 1 episode of vomiting which contained food material.  Aaron Parker denied any hematemesis or melena.  Aaron Parker denied any fever, chills, chest pain, cough, shortness of breath, palpitations.  Aaron Parker denies any trauma to Aaron Parker abdomen.  Aaron Parker denies any alcohol drinking and history of gallbladder stones.  Aaron Parker denies any testicular pain or GU symptoms.   ED Course: Initial Vital Signs: Temperature 97.6 F orally, RR 17, pulse 103, BP 122/99, SpO2 100% on room air  Significant Labs: sodium 131, potassium 5.4, AST 79, ALT 50, anion gap of 16, lipase of 403, glucose 298, beta-hydroxybutyrate was positive  Imaging CT Abdomen & Pelvis w contrast>>IMPRESSION: 1. Acute pancreatitis. 2. Splenomegaly. 3. Colonic diverticulosis without evidence of diverticulitis Medications Administered: insulin  drip and IV fluid and pain medications   [facility] asked to admit for further workup and treatment.  Please see Significant Hospital Events section below for full detailed hospital course.   Pertinent  Medical History   Past Medical History:   Diagnosis Date   Chronic abdominal pain    Diabetes (HCC)    Eczema    GERD (gastroesophageal reflux disease)    Muscle strain    Wolff-Parkinson-White (WPW) syndrome      Micro Data:  N/A  Antimicrobials:   Anti-infectives (From admission, onward)    None       Significant Hospital Events: Including procedures, antibiotic start and stop dates in addition to other pertinent events   12/8: Admitted by [facility] for DKA and Acute Pancreatitis.  Found to have triglycerides in the 2000's.  Transfer to ICU for insulin  gtt, PCCM consulted.   Interim History / Subjective:  As outlined above under Significant Hospital Events section  Objective   Blood pressure 117/82, pulse (!) 124, temperature 100 F (37.8 C), temperature source Oral, resp. rate 16, height 5' 10.5 (1.791 m), weight 106.6 kg, SpO2 99%.        Intake/Output Summary (Last 24 hours) at 05/16/2024 1708 Last data filed at 05/16/2024 1657 Gross per 24 hour  Intake 962.13 ml  Output --  Net 962.13 ml   Filed Weights   05/15/24 2121  Weight: 106.6 kg    Examination: General: Acutely ill appearing male, laying in bed, on room air, in NAD HENT: Atraumatic, normocephalic, neck supple, no JVD Lungs: Clear breath sounds throughout, even, nonlabored, Cardiovascular: Tachycardia, regular rhythm, s1s2, no M/R/G Abdomen: Taut and distended, tender to palpation, no guarding or rebound tenderness, BS+ x4 Extremities: Normal bulk and tone, no deformities, no edema, warm and well perfused Neuro: Awake and alert, oriented x4, moves all extremities purposefully, no focal deficits GU: Deferred  Resolved Hospital  Problem list     Assessment & Plan:   #Acute Pancreatitis due to severe Hypertriglyceridemia  -Monitor fever curve -Trend WBC's  -Follow cultures as above -No indication for ABX currently -IV fluids -Pain control  -Start insulin  drip + D5 infusion  until Triglycerides <500  #DKA ~ RESOLVED  #Diabetes  Mellitus Type II -CBG's q4h; Target range of 140 to 180 -SSI ~ converted back to insulin  drip due to hypertriglyceridemia  -Follow ICU Hypo/Hyperglycemia protocol -Diabetes Coordinator consulted, appreciate input -Check Hgb A1c  #Hyponatremia, suspect Isotonic (Pesudo) in setting of severe Hypertriglyceridemia -Monitor I&O's / urinary output -Follow BMP -Ensure adequate renal perfusion -Avoid nephrotoxic agents as able -Replace electrolytes as indicated ~ Pharmacy following for assistance with electrolyte replacement -IV fluids -Treatment of triglycerides as outlined above          Best Practice (right click and Reselect all SmartList Selections daily)   Diet/type: NPO DVT prophylaxis: SCD GI prophylaxis: PPI Lines: N/A Foley:  N/A Code Status:  full code Last date of multidisciplinary goals of care discussion [N/A]  12/8: Pt and significant other updated at bedside on plan of care.  Labs   CBC: Recent Labs  Lab 05/15/24 2125 05/16/24 0522  WBC 9.8 9.2  NEUTROABS  --  6.1  HGB 14.3 13.6  HCT 38.8* 37.0*  MCV 77.4* 80.3  PLT 363 281    Basic Metabolic Panel: Recent Labs  Lab 05/16/24 0044 05/16/24 0522 05/16/24 1306  NA 131* 130* 130*  K 5.4* 4.1 4.0  CL 94* 96* 96*  CO2 20* 12* 21*  GLUCOSE 299* 250* 236*  BUN 14 14 12   CREATININE 0.82 0.64 0.79  CALCIUM 9.5 8.4* 8.9   GFR: Estimated Creatinine Clearance: 174.1 mL/min (by C-G formula based on SCr of 0.79 mg/dL). Recent Labs  Lab 05/15/24 2125 05/16/24 0522  WBC 9.8 9.2    Liver Function Tests: Recent Labs  Lab 05/16/24 0044  AST 79*  ALT 50*  ALKPHOS 53  BILITOT 0.7  PROT 7.6  ALBUMIN 4.3   Recent Labs  Lab 05/16/24 0044  LIPASE 403*   No results for input(s): AMMONIA in the last 168 hours.  ABG    Component Value Date/Time   HCO3 27.8 05/16/2024 0529   O2SAT 67.4 05/16/2024 0529     Coagulation Profile: No results for input(s): INR, PROTIME in the last 168  hours.  Cardiac Enzymes: No results for input(s): CKTOTAL, CKMB, CKMBINDEX, TROPONINI in the last 168 hours.  HbA1C: Hemoglobin A1C  Date/Time Value Ref Range Status  11/24/2022 10:52 AM 8.8 (A) 4.0 - 5.6 % Final   Hgb A1c MFr Bld  Date/Time Value Ref Range Status  06/19/2023 09:59 AM 10.7 (H) 4.8 - 5.6 % Final    Comment:             Prediabetes: 5.7 - 6.4          Diabetes: >6.4          Glycemic control for adults with diabetes: <7.0   08/04/2022 03:00 PM 11.4 (H) 4.8 - 5.6 % Final    Comment:             Prediabetes: 5.7 - 6.4          Diabetes: >6.4          Glycemic control for adults with diabetes: <7.0     CBG: Recent Labs  Lab 05/16/24 0632 05/16/24 0738 05/16/24 0844 05/16/24 0937 05/16/24 1217  GLUCAP 211* 212*  201* 172* 260*    Review of Systems:   Positives in BOLD: Gen: Denies fever, chills, weight change, fatigue, night sweats HEENT: Denies blurred vision, double vision, hearing loss, tinnitus, sinus congestion, rhinorrhea, sore throat, neck stiffness, dysphagia PULM: Denies shortness of breath, cough, sputum production, hemoptysis, wheezing CV: Denies chest pain, edema, orthopnea, paroxysmal nocturnal dyspnea, palpitations GI: Denies abdominal pain, nausea, vomiting, diarrhea, hematochezia, melena, constipation, change in bowel habits GU: Denies dysuria, hematuria, polyuria, oliguria, urethral discharge Endocrine: Denies hot or cold intolerance, polyuria, polyphagia or appetite change Derm: Denies rash, dry skin, scaling or peeling skin change Heme: Denies easy bruising, bleeding, bleeding gums Neuro: Denies headache, numbness, weakness, slurred speech, loss of memory or consciousness   Past Medical History:  Aaron Parker,  has a past medical history of Chronic abdominal pain, Diabetes (HCC), Eczema, GERD (gastroesophageal reflux disease), Muscle strain, and Wolff-Parkinson-White (WPW) syndrome.   Surgical History:   Past Surgical History:   Procedure Laterality Date   ADENOIDECTOMY  2007   TONSILLECTOMY       Social History:   reports that Aaron Parker has never smoked. Aaron Parker has been exposed to tobacco smoke. Aaron Parker has never used smokeless tobacco. Aaron Parker reports that Aaron Parker does not currently use alcohol. Aaron Parker reports that Aaron Parker does not currently use drugs.   Family History:  Aaron Parker family history is not on file.   Allergies Allergies  Allergen Reactions   Codeine Hives     Home Medications  Prior to Admission medications   Medication Sig Start Date End Date Taking? Authorizing Provider  insulin  degludec (TRESIBA  FLEXTOUCH) 100 UNIT/ML FlexTouch Pen Inject 40 Units into the skin daily. 05/16/24  Yes Cyrena Mylar, MD  liraglutide  (VICTOZA ) 18 MG/3ML SOPN INJECT 0.6 MG UNDER THE SKIN ONCE DAILY 09/01/23  Yes Simmons-Robinson, Makiera, MD  lisinopril  (ZESTRIL ) 5 MG tablet Take 1 tablet (5 mg total) by mouth daily. 05/16/24  Yes Cyrena Mylar, MD  metFORMIN  (GLUCOPHAGE -XR) 750 MG 24 hr tablet Take 1 tablet (750 mg total) by mouth daily with breakfast. 05/16/24  Yes Cyrena Mylar, MD  Blood Glucose Monitoring Suppl (ONE TOUCH ULTRA 2) w/Device KIT USE TO TEST BLOOD SUGAR IN THE MORNING, AT NOON, AND AT BEDTIME 09/04/22   Simmons-Robinson, Makiera, MD  Glucose Blood (BLOOD GLUCOSE TEST STRIPS) STRP 1 each by In Vitro route in the morning and at bedtime. May substitute to any manufacturer covered by Aaron Parker's insurance. 08/07/22 01/28/23  Simmons-Robinson, Rockie, MD  Insulin  Pen Needle 31G X 8 MM MISC 1 Needle by Does not apply route in the morning and at bedtime. 05/16/24   Cyrena Mylar, MD  Lancet Device MISC Use to test blood glucose while fasting in the morning and 2 hours largest meal of the day 05/16/24   Cyrena Mylar, MD  Lancets Misc. MISC Use to test blood glucose while fasting in the morning and 2 hours largest meal of the day Aaron Parker not taking: Reported on 06/19/2023 08/07/22   Simmons-Robinson, Rockie, MD  omeprazole  (PRILOSEC) 20 MG capsule Take 1 capsule  (20 mg total) by mouth daily. Aaron Parker not taking: Reported on 05/16/2024 11/24/22   [place], PA-C     Care time: 45 minutes     Inge Lecher, AGACNP-BC [facility] Prefer epic messenger for cross cover needs If after hours, please call E-link

## 2024-05-16 NOTE — Plan of Care (Signed)

## 2024-05-16 NOTE — Progress Notes (Signed)
   Brief Progress Note   _____________________________________________________________________________________________________________  Patient Name: Aaron Parker Patient DOB: 14-Dec-2000 Date: @TODAY @      Data: Reviewed labs, vital signs, and notes.     Action: Reached out to attending for downgrade orders.    Response:  Attending downgraded patient to Tele.  _____________________________________________________________________________________________________________  [facility] RN Expeditor Asher Torpey S Cyprian [place] Please contact us  directly via secure chat (search for [facility]) or by calling us  at [phone] [facility]).

## 2024-05-16 NOTE — ED Notes (Signed)
 CCMD notified of pt placement on cardiac monitor.

## 2024-05-16 NOTE — Inpatient Diabetes Management (Addendum)
 Inpatient Diabetes Program Recommendations  AACE/ADA: New Consensus Statement on Inpatient Glycemic Control   Target Ranges:  Prepandial:   less than 140 mg/dL      Peak postprandial:   less than 180 mg/dL (1-2 hours)      Critically ill patients:  140 - 180 mg/dL    Latest Reference Range & Units 05/16/24 02:24 05/16/24 04:44 05/16/24 05:21 05/16/24 06:32 05/16/24 07:38 05/16/24 08:44  Glucose-Capillary 70 - 99 mg/dL 701 (H) 707 (H) 756 (H) 211 (H) 212 (H) 201 (H)    Latest Reference Range & Units 05/16/24 00:44 05/16/24 05:22  CO2 22 - 32 mmol/L 20 (L) 12 (L)  Glucose 70 - 99 mg/dL 700 (H) 749 (H)  Anion gap 5 - 15  16 (H) 22 (H)    Latest Reference Range & Units 05/16/24 05:22  Beta-Hydroxybutyric Acid 0.05 - 0.27 mmol/L 0.86 (H)    Latest Reference Range & Units 05/16/24 00:44  Lipase 11 - 51 U/L 403 (H)  AST 15 - 41 U/L 79 (H)  ALT 0 - 44 U/L 50 (H)    Review of Glycemic Control  Diabetes history: DM2 Outpatient Diabetes medications: Tresiba  40 units daily, Metformin  XR 750 mg QAM, Victoza  0.6 mg daily (not taken any DM meds since March 2025) Current orders for Inpatient glycemic control: IV insulin ; transitioning to Lantus  20 units daily, Novolog  0-15 units TID with meals, Novolog  0-5 units QHS  Inpatient Diabetes Program Recommendations:    Insulin : Patient is currently ordered IV insulin  but is being transitioned to SQ insulin . Patient received Lantus  20 units at 7:42 am today.  Outpatient: Since patient has pancreatitis, would recommend to stop Victoza  outpatient (if patient is taking it).   If patient does not currently have insurance, will need to get meds from [facility] outpatient [facility] pharmacy and they have Humalog 75/25 insulin  pens (#[id number]), insulin  pen needles [phone]).  Would recommend discharging on Humalog 75/25 15 units BID (would provide a total of 22.5 units for basal and 7.5 units for meal coverage per day).  NOTE: Patient in ED with abdominal pain, nausea,  vomiting, hyperglycemia. Per ED note on 05/15/24, patient reports that no DM medications in several months due to insurance issues. Per note by Dr. Cyrena, patient reports he now has insurance (no insurance currently listed in chart). Last PCP visit was 06/19/23 and it was noted that recent job change which led to a temporary loss of insurance, causing a disruption in his medication regimen. He has not been taking his prescribed insulin  or Ozempic . Patient was advised to continue Lantus  40 units daily, continue Ozempic  0.5 mg Qweek, and continue Metformin  750 mg daily.    Addendum 05/16/24@11 :15-Spoke with patient at bedside about diabetes and home regimen for diabetes control. Patient reports he did not have any insurance and that he has a new job and is suppose to now have insurance with [employer] through employer [employer]).  Patient states that he has not had any DM meds since March 2025. Patient pulled up information on his phone to show he was prescribed Tresiba  40 units daily, Metformin  XR 750 mg daily, and Victoza  0.6 mg Qweek for DM.  Patient has been having symptoms of hyperglycemia for months. Patient has glucometer and testing supplies at home but not checking glucose very often; he reports CBGs in 300's mg/dl when he has checked it.  Discussed glucose and A1C goals. Discussed importance of checking CBGs and maintaining good CBG control to prevent long-term and short-term  complications. Explained how hyperglycemia leads to damage within blood vessels which lead to the common complications seen with uncontrolled diabetes. Stressed to the patient the importance of improving glycemic control to prevent further complications from uncontrolled diabetes especially given he is only 23 years old.  Patient reports he will plan to get re-established with prior PCP (last seen 06/19/23).  Discussed that we will need to verify insurance information to help determine which insulins are covered under new insurance. Discussed  that if he does not have active insurance, may need to see if he can get insulin  from [facility] pharmacy. Discussed generic insulins from [facility] as well (Novolin N, R, 70/30) and how they work and discussed that they are $43 per box of 5 insulin  pens.  Patient verbalized understanding of information discussed and reports no further questions at this time related to diabetes. Discussed that if [facility] pharmacy has mixed insulin , may ask for that and patient could purchase Novolin 70/30 mixed insulin  over the counter at [facility] if needed until his insurance is active. Asked that he call his prior PCP and see if he can get re-established with them and get an appointment for follow up in the near future. Patient states understanding of information and appreciative of information discussed and assistance with getting insulin .   Asked outpatient [facility] pharmacy to see if any insurance is active for patient through [employer] and no insurance information was found.  Will need to get medications from [facility] at discharge; they have Humalog Kwikpens 75/25.   Due to patient needing basal and bolus insulin  and unsure if or when insurance will be active, would recommend to discharge on mixed insulin  since patient could purchase Novolin 70/30 insulin  pens over the counter at [facility] if needed for $43 per box of 5 insulin  pens.   Thanks, Earnie Gainer, RN, MSN, CDCES Diabetes Coordinator Inpatient Diabetes Program [phone] (Team Pager from 8am to 5pm)

## 2024-05-16 NOTE — ED Notes (Signed)
 Spoke with [place] in lab regarding delay in lab results.  She stated that they are continuing to try to result the labs.  Delay caused d/t the blood being milky.

## 2024-05-17 ENCOUNTER — Inpatient Hospital Stay: Admit: 2024-05-17 | Discharge: 2024-05-17 | Disposition: A | Payer: Self-pay | Attending: Hospitalist

## 2024-05-17 DIAGNOSIS — I456 Pre-excitation syndrome: Secondary | ICD-10-CM | POA: Insufficient documentation

## 2024-05-17 LAB — GLUCOSE, CAPILLARY
Glucose-Capillary: 101 mg/dL — ABNORMAL HIGH (ref 70–99)
Glucose-Capillary: 102 mg/dL — ABNORMAL HIGH (ref 70–99)
Glucose-Capillary: 102 mg/dL — ABNORMAL HIGH (ref 70–99)
Glucose-Capillary: 105 mg/dL — ABNORMAL HIGH (ref 70–99)
Glucose-Capillary: 107 mg/dL — ABNORMAL HIGH (ref 70–99)
Glucose-Capillary: 109 mg/dL — ABNORMAL HIGH (ref 70–99)
Glucose-Capillary: 113 mg/dL — ABNORMAL HIGH (ref 70–99)
Glucose-Capillary: 114 mg/dL — ABNORMAL HIGH (ref 70–99)
Glucose-Capillary: 115 mg/dL — ABNORMAL HIGH (ref 70–99)
Glucose-Capillary: 124 mg/dL — ABNORMAL HIGH (ref 70–99)
Glucose-Capillary: 136 mg/dL — ABNORMAL HIGH (ref 70–99)
Glucose-Capillary: 136 mg/dL — ABNORMAL HIGH (ref 70–99)
Glucose-Capillary: 158 mg/dL — ABNORMAL HIGH (ref 70–99)
Glucose-Capillary: 161 mg/dL — ABNORMAL HIGH (ref 70–99)
Glucose-Capillary: 166 mg/dL — ABNORMAL HIGH (ref 70–99)
Glucose-Capillary: 215 mg/dL — ABNORMAL HIGH (ref 70–99)
Glucose-Capillary: 88 mg/dL (ref 70–99)
Glucose-Capillary: 95 mg/dL (ref 70–99)
Glucose-Capillary: 97 mg/dL (ref 70–99)
Glucose-Capillary: 98 mg/dL (ref 70–99)
Glucose-Capillary: 99 mg/dL (ref 70–99)

## 2024-05-17 LAB — MAGNESIUM: Magnesium: 2 mg/dL (ref 1.7–2.4)

## 2024-05-17 LAB — CBC WITH DIFFERENTIAL/PLATELET
Abs Immature Granulocytes: 0.05 K/uL (ref 0.00–0.07)
Basophils Absolute: 0.1 K/uL (ref 0.0–0.1)
Basophils Relative: 1 %
Eosinophils Absolute: 0.1 K/uL (ref 0.0–0.5)
Eosinophils Relative: 0 %
HCT: 34.6 % — ABNORMAL LOW (ref 39.0–52.0)
Hemoglobin: 12.3 g/dL — ABNORMAL LOW (ref 13.0–17.0)
Immature Granulocytes: 0 %
Lymphocytes Relative: 18 %
Lymphs Abs: 2.1 K/uL (ref 0.7–4.0)
MCH: 26.9 pg (ref 26.0–34.0)
MCHC: 35.5 g/dL (ref 30.0–36.0)
MCV: 75.7 fL — ABNORMAL LOW (ref 80.0–100.0)
Monocytes Absolute: 1.1 K/uL — ABNORMAL HIGH (ref 0.1–1.0)
Monocytes Relative: 10 %
Neutro Abs: 7.9 K/uL — ABNORMAL HIGH (ref 1.7–7.7)
Neutrophils Relative %: 71 %
Platelets: 208 K/uL (ref 150–400)
RBC: 4.57 MIL/uL (ref 4.22–5.81)
RDW: 13.8 % (ref 11.5–15.5)
WBC: 11.2 K/uL — ABNORMAL HIGH (ref 4.0–10.5)
nRBC: 0 % (ref 0.0–0.2)

## 2024-05-17 LAB — BETA-HYDROXYBUTYRIC ACID
Beta-Hydroxybutyric Acid: <0.05 mmol/L — ABNORMAL LOW (ref 0.05–0.27)
Beta-Hydroxybutyric Acid: <0.05 mmol/L — ABNORMAL LOW (ref 0.05–0.27)
Beta-Hydroxybutyric Acid: <0.05 mmol/L — ABNORMAL LOW (ref 0.05–0.27)

## 2024-05-17 LAB — BLOOD GAS, VENOUS
Bicarbonate: 29.8 mmol/L — ABNORMAL HIGH (ref 20.0–28.0)
O2 Saturation: 45.4 % — AB (ref 0.0–2.0)
Patient temperature: 37
Patient temperature: 45.4 %
pCO2, Ven: 46 mmHg (ref 44–60)
pH, Ven: 7.42 (ref 7.25–7.43)
pO2, Ven: <31 mmHg — CL (ref 32–45)

## 2024-05-17 LAB — ECHOCARDIOGRAM COMPLETE
AR max vel: 3.1 cm2
AV Area VTI: 3.05 cm2
AV Area mean vel: 3.3 cm2
AV Mean grad: 4 mmHg
AV Peak grad: 8.9 mmHg
Ao pk vel: 1.49 m/s
Area-P 1/2: 6.12 cm2
Calc EF: 45.6 %
Height: 70 in
MV VTI: 2.77 cm2
S' Lateral: 1.6 cm
Single Plane A2C EF: 53 %
Single Plane A4C EF: 43.1 %
Weight: 3509.72 [oz_av]

## 2024-05-17 LAB — BASIC METABOLIC PANEL WITH GFR
Anion gap: 10 (ref 5–15)
Anion gap: 11 (ref 5–15)
BUN: 7 mg/dL (ref 6–20)
BUN: 7 mg/dL (ref 6–20)
CO2: 26 mmol/L (ref 22–32)
CO2: 23 mmol/L (ref 22–32)
Calcium: 9.4 mg/dL (ref 8.9–10.3)
Calcium: 9.4 mg/dL (ref 8.9–10.3)
Chloride: 99 mmol/L (ref 98–111)
Chloride: 100 mmol/L (ref 98–111)
Creatinine, Ser: 0.72 mg/dL (ref 0.61–1.24)
Creatinine, Ser: 0.63 mg/dL (ref 0.61–1.24)
GFR, Estimated: >60 mL/min (ref >60)
GFR, Estimated: >60 mL/min (ref >60)
Glucose, Bld: 100 mg/dL — ABNORMAL HIGH (ref 70–99)
Glucose, Bld: 123 mg/dL — ABNORMAL HIGH (ref 70–99)
Potassium: 3.1 mmol/L — ABNORMAL LOW (ref 3.5–5.1)
Potassium: 3.5 mmol/L (ref 3.5–5.1)
Sodium: 135 mmol/L (ref 135–145)
Sodium: 134 mmol/L — ABNORMAL LOW (ref 135–145)

## 2024-05-17 LAB — PHOSPHORUS: Phosphorus: 2.4 mg/dL — ABNORMAL LOW (ref 2.5–4.6)

## 2024-05-17 LAB — TRIGLYCERIDES
Triglycerides: 800 mg/dL — ABNORMAL HIGH (ref <150)
Triglycerides: 598 mg/dL — ABNORMAL HIGH (ref <150)

## 2024-05-17 MED ORDER — PANTOPRAZOLE SODIUM 40 MG PO TBEC
40.0000 mg | DELAYED_RELEASE_TABLET | Freq: Every day | ORAL | Status: DC
  Administered 2024-05-18 – 2024-05-20 (×3): 40 mg via ORAL
  Filled 2024-05-17 (×3): qty 1

## 2024-05-17 MED ORDER — POTASSIUM CHLORIDE 10 MEQ/100ML IV SOLN
10.0000 meq | INTRAVENOUS | Status: AC
  Administered 2024-05-17: 10 meq via INTRAVENOUS
  Filled 2024-05-17 (×2): qty 100

## 2024-05-17 MED ORDER — ENOXAPARIN SODIUM 60 MG/0.6ML IJ SOSY
50.0000 mg | PREFILLED_SYRINGE | INTRAMUSCULAR | Status: DC
  Administered 2024-05-17 – 2024-05-20 (×4): 50 mg via SUBCUTANEOUS
  Filled 2024-05-17 (×4): qty 0.6

## 2024-05-17 MED ORDER — POTASSIUM CHLORIDE CRYS ER 20 MEQ PO TBCR
40.0000 meq | EXTENDED_RELEASE_TABLET | Freq: Once | ORAL | Status: AC
  Administered 2024-05-17: 40 meq via ORAL
  Filled 2024-05-17: qty 2

## 2024-05-17 MED ORDER — HYDROMORPHONE HCL 1 MG/ML IJ SOLN
0.5000 mg | Freq: Once | INTRAMUSCULAR | Status: DC
  Filled 2024-05-17: qty 1

## 2024-05-17 MED ORDER — POTASSIUM CHLORIDE 2 MEQ/ML IV SOLN
INTRAVENOUS | Status: DC
  Filled 2024-05-17 (×5): qty 1000

## 2024-05-17 MED ORDER — PANTOPRAZOLE SODIUM 40 MG PO TBEC: 40.0000 mg | DELAYED_RELEASE_TABLET | Freq: Every day | ORAL | Status: DC

## 2024-05-17 NOTE — Progress Notes (Signed)
 PROGRESS NOTE    Aaron Parker  FMW:[id number] DOB: 2000-08-10 DOA: 05/15/2024 PCP: Aaron Coyer, MD  Outpatient Specialists: cardiology    Brief Narrative:   From admission h and p  Aaron Parker is a pleasant 23 y.o. male with medical history significant for type 2 diabetes not on any medications for several months due to insurance issues, chronic abdominal pain, GERD, Wolff-Parkinson-White syndrome not on any medications who came into ED complaining of abdominal pain started 4 PM  yesterday.  Patient is stated that his pain was severe at 9.5/10, left upper quadrant and epigastric region, associated with nausea and 1 episode of vomiting which contained food material.  He denied any hematemesis or melena.  He denied any fever, chills, chest pain, cough, shortness of breath, palpitations.  He denies any trauma to his abdomen.  He denies any alcohol drinking and history of gallbladder stones.  He denies any testicular pain or GU symptoms.   Assessment & Plan:   Principal Problem:   DKA (diabetic ketoacidosis) (HCC) Active Problems:   MDD (major depressive disorder)   GERD (gastroesophageal reflux disease)   Chronic abdominal pain   Type 2 diabetes mellitus with diabetic polyneuropathy, with long-term current use of insulin  (HCC)   Pancreatitis, acute   Hyperglycemia   WPW (Wolff-Parkinson-White syndrome)  # Acute pancreatitis 2/2 hypertriglyceridemia. No stone or biliary duct dilation on CT. On no meds last 9 months so unlikely med side effect. Improving - clears today, advance tonight if tolerates  # Severe hypertriglyceridemia Likely 2/2 uncontrolled DM. Greater than 2000 on arrival, improved to 800 today with iv insulin  - continue iv insulin  and dextrose  until less than 500 then will start fibrate - RD consult for instructions for severe fat restricted diet - consider referral to lipid specialist on discharge. Per [facility] cardiology, can see Dr. Chad Parker in  [place] or Dr. Okey Parker at [facility]  # T2DM, uncontrolled # DKA Off meds 9 months as lost insurance - dm educator recs appreciated, will start 75/25 when off iv insulin  - should probably avoid glp-1s in the future (though not the culprit of this pancreatitis as off meds 9 months)  # History WPW Noted  # Obesity noted    DVT prophylaxis: lovenox  Code Status: full Family Communication: partner at bedside  Level of care: ICU Status is: Inpatient Remains inpatient appropriate because: severity of illness    Consultants:  Pccm (signed off)  Procedures: none  Antimicrobials:  none    Subjective: Reports mild improving epigastric pain. hungry  Objective: Vitals:   05/17/24 0500 05/17/24 0600 05/17/24 0700 05/17/24 0800  BP:   116/73 123/77  Pulse: (!) 109 (!) 109 (!) 113 (!) 121  Resp: (!) 21 (!) 23 (!) 23 18  Temp:    99 F (37.2 C)  TempSrc:    Oral  SpO2: 94% 94% 96% 95%  Weight:      Height:        Intake/Output Summary (Last 24 hours) at 05/17/2024 0918 Last data filed at 05/17/2024 0800 Gross per 24 hour  Intake 1815.07 ml  Output 300 ml  Net 1515.07 ml   Filed Weights   05/15/24 2121 05/16/24 1705  Weight: 106.6 kg 99.5 kg    Examination:  General exam: Appears calm and comfortable  Respiratory system: Clear to auscultation. Respiratory effort normal. Cardiovascular system: S1 & S2 heard, RRR. No JVD, murmurs, rubs, gallops or clicks. No pedal edema. Gastrointestinal system: Abdomen is nondistended, soft, mild epigastric  tenderness Central nervous system: Alert and oriented. No focal neurological deficits. Extremities: Symmetric 5 x 5 power. Skin: No rashes, lesions or ulcers Psychiatry: Judgement and insight appear normal. Mood & affect appropriate.     Data Reviewed: I have personally reviewed following labs and imaging studies  CBC: Recent Labs  Lab 05/15/24 2125 05/16/24 0522 05/16/24 2131 05/17/24 0132  WBC 9.8 9.2 9.3 11.2*   NEUTROABS  --  6.1 6.8 7.9*  HGB 14.3 13.6 13.8 12.3*  HCT 38.8* 37.0* 39.4 34.6*  MCV 77.4* 80.3 77.3* 75.7*  PLT 363 281 215 208   Basic Metabolic Panel: Recent Labs  Lab 05/16/24 1306 05/16/24 1812 05/16/24 2131 05/17/24 0132 05/17/24 0318  NA 130* 131* 133* 135 134*  K 4.0 3.5 3.3* 3.1* 3.5  CL 96* 94* 95* 99 100  CO2 21* 20* 25 26 23   GLUCOSE 236* 204* 141* 100* 123*  BUN 12 7 6 7 7   CREATININE 0.79 0.56* 0.56* 0.72 0.63  CALCIUM 8.9 9.2 9.4 9.4 9.4  MG  --   --   --   --  2.0  PHOS  --   --   --   --  2.4*   GFR: Estimated Creatinine Clearance: 166.9 mL/min (by C-G formula based on SCr of 0.63 mg/dL). Liver Function Tests: Recent Labs  Lab 05/16/24 0044 05/16/24 2131  AST 79* 18  ALT 50* 35  ALKPHOS 53 57  BILITOT 0.7 0.8  PROT 7.6 6.7  ALBUMIN 4.3 4.1   Recent Labs  Lab 05/16/24 0044  LIPASE 403*   No results for input(s): AMMONIA in the last 168 hours. Coagulation Profile: No results for input(s): INR, PROTIME in the last 168 hours. Cardiac Enzymes: No results for input(s): CKTOTAL, CKMB, CKMBINDEX, TROPONINI in the last 168 hours. BNP (last 3 results) No results for input(s): PROBNP in the last 8760 hours. HbA1C: No results for input(s): HGBA1C in the last 72 hours. CBG: Recent Labs  Lab 05/17/24 0241 05/17/24 0438 05/17/24 0623 05/17/24 0729 05/17/24 0829  GLUCAP 114* 124* 102* 99 105*   Lipid Profile: Recent Labs    05/16/24 1306 05/16/24 1812 05/17/24 0318  CHOL 301*  --   --   HDL 17*  --   --   LDLCALC 122  --   --   TRIG 2,027* 1,523* 800*  CHOLHDL 17.5  --   --    Thyroid  Function Tests: No results for input(s): TSH, T4TOTAL, FREET4, T3FREE, THYROIDAB in the last 72 hours. Anemia Panel: No results for input(s): VITAMINB12, FOLATE, FERRITIN, TIBC, IRON, RETICCTPCT in the last 72 hours. Urine analysis:    Component Value Date/Time   COLORURINE YELLOW (A) 05/15/2024 2327    APPEARANCEUR CLEAR (A) 05/15/2024 2327   LABSPEC 1.040 (H) 05/15/2024 2327   PHURINE 6.0 05/15/2024 2327   GLUCOSEU >=500 (A) 05/15/2024 2327   HGBUR NEGATIVE 05/15/2024 2327   BILIRUBINUR NEGATIVE 05/15/2024 2327   KETONESUR 80 (A) 05/15/2024 2327   PROTEINUR >=300 (A) 05/15/2024 2327   NITRITE NEGATIVE 05/15/2024 2327   LEUKOCYTESUR NEGATIVE 05/15/2024 2327   Sepsis Labs: @LABRCNTIP (procalcitonin:4,lacticidven:4)  ) Recent Results (from the past 240 hours)  MRSA Next Gen by PCR, Nasal     Status: None   Collection Time: 05/16/24  6:39 PM   Specimen: Nasal Mucosa; Nasal Swab  Result Value Ref Range Status   MRSA by PCR Next Gen NOT DETECTED NOT DETECTED Final    Comment: (NOTE) The GeneXpert MRSA Assay (FDA approved  for NASAL specimens only), is one component of a comprehensive MRSA colonization surveillance program. It is not intended to diagnose MRSA infection nor to guide or monitor treatment for MRSA infections. Test performance is not FDA approved in patients less than 60 years old. Performed at [facility], [address] [place], [place] [address]          Radiology Studies: CT ABDOMEN PELVIS W CONTRAST Result Date: 05/16/2024 EXAM: CT ABDOMEN AND PELVIS WITH CONTRAST 12/17/2019 TECHNIQUE: CT of the abdomen and pelvis was performed with the administration of 100 mL of iohexol  (OMNIPAQUE ) 300 MG/ML solution. Multiplanar reformatted images are provided for review. Automated exposure control, iterative reconstruction, and/or weight-based adjustment of the mA/kV was utilized to reduce the radiation dose to as low as reasonably achievable. COMPARISON: Ultrasound abdomen 08/14/2022, CT abdomen and pelvis 12/28/2022 CLINICAL HISTORY: Abdominal pain, acute, nonlocalized; RLQ abdominal pain; R sided abd crampy pain nv after eating. FINDINGS: LOWER CHEST: No acute abnormality. LIVER: Diffusely hypodense hepatic parenchyma relative to the spleen. GALLBLADDER AND BILE  DUCTS: Gallbladder is unremarkable. No biliary ductal dilatation. SPLEEN: Enlarged spleen measuring up to 14 cm. PANCREAS: Pancreatic fat stranding along the pancreatic tail and distal body. ADRENAL GLANDS: No acute abnormality. KIDNEYS, URETERS AND BLADDER: No stones in the kidneys or ureters. No hydronephrosis. No perinephric or periureteral stranding. Urinary bladder is unremarkable. GI AND BOWEL: Stomach demonstrates no acute abnormality. Colonic diverticulosis. No small or large bowel thickening or dilatation. The appendix is unremarkable. PERITONEUM AND RETROPERITONEUM: No ascites. No free air. VASCULATURE: Aorta is normal in caliber. LYMPH NODES: No lymphadenopathy. REPRODUCTIVE ORGANS: Normal prostate. BONES AND SOFT TISSUES: No acute osseous abnormality. No focal soft tissue abnormality. IMPRESSION: 1. Acute pancreatitis. 2. Splenomegaly. 3. Colonic diverticulosis without evidence of diverticulitis. Electronically signed by: Aaron Naveau MD 05/16/2024 03:15 AM EST RP Workstation: [id number]        Scheduled Meds:  enoxaparin  (LOVENOX ) injection  40 mg Subcutaneous Q24H    HYDROmorphone  (DILAUDID ) injection  0.5 mg Intravenous Once   pantoprazole  (PROTONIX ) IV  40 mg Intravenous Q12H   senna-docusate  1 tablet Oral BID   Continuous Infusions:  dextrose  5% lactated ringers  1,000 mL with potassium chloride  40 mEq infusion 125 mL/hr at 05/17/24 0800   insulin  0.1 Units/kg/hr (05/17/24 0800)     LOS: 1 day     Devaughn KATHEE Ban, MD [facility]   If 7PM-7AM, please contact night-coverage [email or web address] Password [email or web address] 05/17/2024, 9:18 AM

## 2024-05-17 NOTE — Inpatient Diabetes Management (Signed)
 Inpatient Diabetes Program Recommendations  AACE/ADA: New Consensus Statement on Inpatient Glycemic Control   Target Ranges:  Prepandial:   less than 140 mg/dL      Peak postprandial:   less than 180 mg/dL (1-2 hours)      Critically ill patients:  140 - 180 mg/dL    Latest Reference Range & Units 05/17/24 00:46 05/17/24 01:47 05/17/24 02:41 05/17/24 04:38 05/17/24 06:23 05/17/24 07:29  Glucose-Capillary 70 - 99 mg/dL 892 (H) 95 885 (H) 875 (H) 102 (H) 99    Latest Reference Range & Units 05/16/24 09:37 05/16/24 12:17 05/16/24 17:18 05/16/24 18:13 05/16/24 19:31 05/16/24 20:45 05/16/24 21:44 05/16/24 22:30 05/16/24 23:41  Glucose-Capillary 70 - 99 mg/dL 827 (H) 739 (H) 772 (H) 225 (H) 207 (H) 162 (H) 133 (H) 121 (H) 103 (H)    Latest Reference Range & Units 05/16/24 13:06 05/16/24 18:12 05/17/24 03:18  Triglycerides <150 mg/dL 7,972 (H) 8,476 (H) 199 (H)   Review of Glycemic Control  Diabetes history: DM2 Outpatient Diabetes medications: Tresiba  40 units daily, Metformin  XR 750 mg QAM, Victoza  0.6 mg daily (not taken any DM meds since March 2025) Current orders for Inpatient glycemic control: IV insulin    Inpatient Diabetes Program Recommendations:     Insulin : Patient is currently ordered IV insulin  for hypertriglyceridemia. Once provider is ready to transition off IV to SQ insulin , please consider ordering insulin  glargine 23 units Q24H, CBGs Q4H, and Novolog  0-15 units Q4H. Once diet is started, consider ordering Novolog  4 units TID with meals for meal coverage if patient eats at least 50% of meals.  Outpatient: Since patient has pancreatitis, would recommend to stop Victoza  outpatient (if patient is taking it).   If patient does not currently have insurance, will need to get meds from [facility] outpatient [facility] pharmacy and they have Humalog  75/25 insulin  pens (#[id number]), insulin  pen needles [phone]).  Would recommend discharging on Humalog  75/25 15 units BID (would provide a total of 22.5  units for basal and 7.5 units for meal coverage per day).  Thanks, Earnie Gainer, RN, MSN, CDCES Diabetes Coordinator Inpatient Diabetes Program [phone] (Team Pager from 8am to 5pm)

## 2024-05-17 NOTE — Progress Notes (Signed)
  Chaplain On-Call responded to Spiritual Care Consult Order from Nena Rebel, MD. The request was for Advance Directives information for the patient.  Chaplain met the patient and Significant Other [name] at bedside. and provided the AD documents and education. The patient stated that he wants to create the HCPOA .  Chaplain described the process for completion if he chooses to do this while in the hospital. The patient stated his understanding.  Chaplain Bebe Ardean EMERSON Hershal., [facility]

## 2024-05-17 NOTE — Plan of Care (Signed)

## 2024-05-18 DIAGNOSIS — E1110 Type 2 diabetes mellitus with ketoacidosis without coma: Secondary | ICD-10-CM

## 2024-05-18 DIAGNOSIS — K858 Other acute pancreatitis without necrosis or infection: Secondary | ICD-10-CM

## 2024-05-18 DIAGNOSIS — E781 Pure hyperglyceridemia: Secondary | ICD-10-CM

## 2024-05-18 LAB — BASIC METABOLIC PANEL WITH GFR
Anion gap: 10 (ref 5–15)
BUN: <5 mg/dL — ABNORMAL LOW (ref 6–20)
CO2: 24 mmol/L (ref 22–32)
Calcium: 9.8 mg/dL (ref 8.9–10.3)
Chloride: 101 mmol/L (ref 98–111)
Creatinine, Ser: 0.69 mg/dL (ref 0.61–1.24)
GFR, Estimated: >60 mL/min (ref >60)
Glucose, Bld: 99 mg/dL (ref 70–99)
Potassium: 3.6 mmol/L (ref 3.5–5.1)
Sodium: 135 mmol/L (ref 135–145)

## 2024-05-18 LAB — GLUCOSE, CAPILLARY
Glucose-Capillary: 103 mg/dL — ABNORMAL HIGH (ref 70–99)
Glucose-Capillary: 103 mg/dL — ABNORMAL HIGH (ref 70–99)
Glucose-Capillary: 104 mg/dL — ABNORMAL HIGH (ref 70–99)
Glucose-Capillary: 118 mg/dL — ABNORMAL HIGH (ref 70–99)
Glucose-Capillary: 133 mg/dL — ABNORMAL HIGH (ref 70–99)
Glucose-Capillary: 138 mg/dL — ABNORMAL HIGH (ref 70–99)
Glucose-Capillary: 179 mg/dL — ABNORMAL HIGH (ref 70–99)
Glucose-Capillary: 182 mg/dL — ABNORMAL HIGH (ref 70–99)
Glucose-Capillary: 192 mg/dL — ABNORMAL HIGH (ref 70–99)
Glucose-Capillary: 202 mg/dL — ABNORMAL HIGH (ref 70–99)
Glucose-Capillary: 205 mg/dL — ABNORMAL HIGH (ref 70–99)
Glucose-Capillary: 216 mg/dL — ABNORMAL HIGH (ref 70–99)
Glucose-Capillary: 91 mg/dL (ref 70–99)
Glucose-Capillary: 96 mg/dL (ref 70–99)
Glucose-Capillary: 97 mg/dL (ref 70–99)

## 2024-05-18 LAB — LDL CHOLESTEROL, DIRECT: Direct LDL: 26 mg/dL (ref 0–99)

## 2024-05-18 LAB — TRIGLYCERIDES: Triglycerides: 370 mg/dL — ABNORMAL HIGH (ref <150)

## 2024-05-18 MED ORDER — INSULIN GLARGINE-YFGN 100 UNIT/ML ~~LOC~~ SOLN
10.0000 [IU] | Freq: Once | SUBCUTANEOUS | Status: AC
  Administered 2024-05-18: 10 [IU] via SUBCUTANEOUS
  Filled 2024-05-18: qty 0.1

## 2024-05-18 MED ORDER — INSULIN GLARGINE-YFGN 100 UNIT/ML ~~LOC~~ SOLN
10.0000 [IU] | Freq: Every day | SUBCUTANEOUS | Status: DC
  Filled 2024-05-18: qty 0.1

## 2024-05-18 MED ORDER — INSULIN GLARGINE-YFGN 100 UNIT/ML ~~LOC~~ SOLN
5.0000 [IU] | Freq: Every day | SUBCUTANEOUS | Status: DC
  Administered 2024-05-18: 5 [IU] via SUBCUTANEOUS
  Filled 2024-05-18: qty 0.05

## 2024-05-18 MED ORDER — INSULIN GLARGINE-YFGN 100 UNIT/ML ~~LOC~~ SOPN
3.0000 [IU] | PEN_INJECTOR | Freq: Every day | SUBCUTANEOUS | Status: DC
  Filled 2024-05-18: qty 3

## 2024-05-18 MED ORDER — KETOROLAC TROMETHAMINE 15 MG/ML IJ SOLN
15.0000 mg | Freq: Four times a day (QID) | INTRAMUSCULAR | Status: DC
  Administered 2024-05-18 – 2024-05-20 (×8): 15 mg via INTRAVENOUS
  Filled 2024-05-18 (×9): qty 1

## 2024-05-18 MED ORDER — INSULIN ASPART 100 UNIT/ML IJ SOLN
0.0000 [IU] | INTRAMUSCULAR | Status: DC
  Administered 2024-05-18 (×3): 5 [IU] via SUBCUTANEOUS
  Administered 2024-05-19 (×2): 3 [IU] via SUBCUTANEOUS
  Filled 2024-05-18 (×2): qty 5
  Filled 2024-05-18 (×2): qty 3
  Filled 2024-05-18: qty 2
  Filled 2024-05-18: qty 5

## 2024-05-18 MED ORDER — GEMFIBROZIL 600 MG PO TABS
600.0000 mg | ORAL_TABLET | Freq: Two times a day (BID) | ORAL | Status: DC
  Administered 2024-05-18 – 2024-05-20 (×4): 600 mg via ORAL
  Filled 2024-05-18 (×5): qty 1

## 2024-05-18 MED ORDER — ADULT MULTIVITAMIN W/MINERALS CH
1.0000 | ORAL_TABLET | Freq: Every day | ORAL | Status: DC
  Administered 2024-05-19 – 2024-05-20 (×2): 1 via ORAL
  Filled 2024-05-18 (×2): qty 1

## 2024-05-18 MED ORDER — ENSURE MAX PROTEIN PO LIQD
11.0000 [oz_av] | Freq: Two times a day (BID) | ORAL | Status: DC
  Administered 2024-05-18: 11 [oz_av] via ORAL

## 2024-05-18 MED ORDER — THIAMINE HCL 100 MG PO TABS
100.0000 mg | ORAL_TABLET | Freq: Every day | ORAL | Status: DC
  Administered 2024-05-19 – 2024-05-20 (×2): 100 mg via ORAL
  Filled 2024-05-18 (×4): qty 1

## 2024-05-18 NOTE — Progress Notes (Signed)
 Pt transferred to RM 139A  at this time. VSS prior to transfer. Pt's girlfriend at bedside.

## 2024-05-18 NOTE — Progress Notes (Signed)
 Initial Nutrition Assessment  DOCUMENTATION CODES:   Obesity unspecified  INTERVENTION:   Ensure Max protein supplement po BID, each supplement provides 150kcal and 30g of protein.  MVI po daily   Thiamine 100mg  po daily x 7 days   Carbohydrate modified/Low fat diet   Pt at high refeed risk; recommend monitor potassium, magnesium and phosphorus labs daily until stable  Daily weights   Pancreatitis and diabetes diet education   NUTRITION DIAGNOSIS:   Inadequate oral intake related to acute illness as evidenced by per patient/family report.  GOAL:   Patient will meet greater than or equal to 90% of their needs  MONITOR:   PO intake, Supplement acceptance, Labs, Weight trends, I & O's, Skin  REASON FOR ASSESSMENT:   Consult Assessment of nutrition requirement/status, Diet education  ASSESSMENT:   23 y/o male with h/o Wolff-Parkinson-White (WPW) syndrome, hidradenitis, scabies, MDD, diverticulosis, GERD, type II DM and HLD who is admitted with DKA and acute pancreatitis.  Met with pt in room today. Pt reports good appetite and oral intake at baseline but reports abdominal pain, nausea and emesis the day of admission. Per chart review, it appears pt has been having chronic intermittent abdominal pain for over a year now. Pt reports that he did not eat much of his clear liquid diet this morning. Pt has now been advanced to a carbohydrate modified, low fat diet. RD discussed with pt the importance of adequate nutrition needed to preserve lean muscle. Pt is agreeable to vanilla Ensure. RD will add supplements and vitamins to help pt meet his estimated needs. Pt is at high refeed risk. Per chart, pt appears fairly weight stable at baseline. Pt provided with pancreatitis and diabetes diet education today.   Medications reviewed and include: lovenox , hydromorphone , insulin , protonix , senokot  Labs reviewed: K 3.6 wnl, BUN <5(L) P 2.4(L), Mg 2.0 wnl- 12/9 Lipase- 403(H)-  12/8 Triglycerides- 370(H) Wbc- 11.2(H)- 12/9  NUTRITION - FOCUSED PHYSICAL EXAM:  Flowsheet Row Most Recent Value  Orbital Region No depletion  Upper Arm Region No depletion  Thoracic and Lumbar Region No depletion  Buccal Region No depletion  Temple Region No depletion  Clavicle Bone Region Mild depletion  Clavicle and Acromion Bone Region Mild depletion  Scapular Bone Region No depletion  Dorsal Hand No depletion  Patellar Region No depletion  Anterior Thigh Region No depletion  Posterior Calf Region No depletion  Edema (RD Assessment) None  Hair Reviewed  Eyes Reviewed  Mouth Reviewed  Skin Reviewed  Nails Reviewed   Diet Order:   Diet Order             Diet regular Room service appropriate? Yes; Fluid consistency: Thin  Diet effective now                  EDUCATION NEEDS:   Education needs have been addressed  Skin:  Skin Assessment: Reviewed RN Assessment (ecchymosis)  Last BM:  12/10- type 4  Height:   Ht Readings from Last 1 Encounters:  05/16/24 5' 10 (1.778 m)    Weight:   Wt Readings from Last 1 Encounters:  05/16/24 99.5 kg    Ideal Body Weight:  75.45 kg  BMI:  Body mass index is 31.47 kg/m.  Estimated Nutritional Needs:   Kcal:  2200-2500kcal/day  Protein:  110-125g/day  Fluid:  2.3-2.6L/day  Augustin Shams MS, RD, LDN If unable to be reached, please send secure chat to RD inpatient available from 8:00a-4:00p daily

## 2024-05-18 NOTE — Progress Notes (Signed)
 PROGRESS NOTE    Aaron Parker  FMW:[id number] DOB: Jul 14, 2000 DOA: 05/15/2024 PCP: Sharma Coyer, MD  Outpatient Specialists: cardiology    Brief Narrative:   From admission h and p  Aaron Parker is a pleasant 23 y.o. male with medical history significant for type 2 diabetes not on any medications for several months due to insurance issues, chronic abdominal pain, GERD, Wolff-Parkinson-White syndrome not on any medications who came into ED complaining of abdominal pain started 4 PM  yesterday.  Patient is stated that his pain was severe at 9.5/10, left upper quadrant and epigastric region, associated with nausea and 1 episode of vomiting which contained food material.  He denied any hematemesis or melena.  He denied any fever, chills, chest pain, cough, shortness of breath, palpitations.  He denies any trauma to his abdomen.  He denies any alcohol drinking and history of gallbladder stones.  He denies any testicular pain or GU symptoms.   Assessment & Plan:   Principal Problem:   DKA (diabetic ketoacidosis) (HCC) Active Problems:   MDD (major depressive disorder)   GERD (gastroesophageal reflux disease)   Chronic abdominal pain   Type 2 diabetes mellitus with diabetic polyneuropathy, with long-term current use of insulin  (HCC)   Pancreatitis, acute   Hyperglycemia   WPW (Wolff-Parkinson-White syndrome)  # Acute pancreatitis 2/2 hypertriglyceridemia. No stone or biliary duct dilation on CT. On no meds last 9 months so unlikely med side effect. No N/V, having mild abdominal pain.  - Advanced diet this PM low fat  # Severe hypertriglyceridemia Likely 2/2 uncontrolled DM. Greater than 2000 on arrival, <500 today.  -Stopped IV insulin  and D5LR -Continue subcutaneous insulin  and fat restricted diet  - consider referral to lipid specialist on discharge. Per [facility] cardiology, can see Dr. Chad Hilty in [place] or Dr. Okey [facility]esa at [facility]  # T2DM, uncontrolled #  DKA Off meds 9 months as lost insurance - dm educator recs appreciated, will start 75/25 when off iv insulin  - should probably avoid glp-1s in the future (though not the culprit of this pancreatitis as off meds 9 months)  # History WPW Noted  # Obesity noted   DVT prophylaxis: lovenox  Code Status: full Family Communication: partner at bedside  Level of care: Med-Surg Status is: Inpatient Remains inpatient appropriate because: severity of illness    Consultants:  Pccm (signed off)  Procedures: none  Antimicrobials:  none    Subjective: Having mild epigastric pain.   Objective: Vitals:   05/18/24 1600 05/18/24 1700 05/18/24 1758 05/18/24 1800  BP:  (!) 136/96 (!) 134/90 (!) 134/90  Pulse: (!) 111 (!) 118 (!) 117 (!) 122  Resp: 17 (!) 22 (!) 29 15  Temp:      TempSrc:      SpO2: 97% 97% 97% 97%  Weight:      Height:        Intake/Output Summary (Last 24 hours) at 05/18/2024 1824 Last data filed at 05/18/2024 1100 Gross per 24 hour  Intake 2723.94 ml  Output --  Net 2723.94 ml   Filed Weights   05/15/24 2121 05/16/24 1705  Weight: 106.6 kg 99.5 kg    Examination:  Constitutional: In no distress.  Cardiovascular: Normal rate, regular rhythm. No lower extremity edema  Pulmonary: Non labored breathing on room air, no wheezing or rales.   Abdominal: Soft. Non distended mild epigastric and LLQ TTP  Musculoskeletal: Normal range of motion.     Neurological: Alert and oriented to person,  place, and time. Non focal  Skin: Skin is warm and dry.    Data Reviewed: I have personally reviewed following labs and imaging studies  CBC: Recent Labs  Lab 05/15/24 2125 05/16/24 0522 05/16/24 2131 05/17/24 0132  WBC 9.8 9.2 9.3 11.2*  NEUTROABS  --  6.1 6.8 7.9*  HGB 14.3 13.6 13.8 12.3*  HCT 38.8* 37.0* 39.4 34.6*  MCV 77.4* 80.3 77.3* 75.7*  PLT 363 281 215 208   Basic Metabolic Panel: Recent Labs  Lab 05/16/24 1812 05/16/24 2131 05/17/24 0132  05/17/24 0318 05/18/24 0309  NA 131* 133* 135 134* 135  K 3.5 3.3* 3.1* 3.5 3.6  CL 94* 95* 99 100 101  CO2 20* 25 26 23 24   GLUCOSE 204* 141* 100* 123* 99  BUN 7 6 7 7  <5*  CREATININE 0.56* 0.56* 0.72 0.63 0.69  CALCIUM 9.2 9.4 9.4 9.4 9.8  MG  --   --   --  2.0  --   PHOS  --   --   --  2.4*  --    GFR: Estimated Creatinine Clearance: 166.9 mL/min (by C-G formula based on SCr of 0.69 mg/dL). Liver Function Tests: Recent Labs  Lab 05/16/24 0044 05/16/24 2131  AST 79* 18  ALT 50* 35  ALKPHOS 53 57  BILITOT 0.7 0.8  PROT 7.6 6.7  ALBUMIN 4.3 4.1   Recent Labs  Lab 05/16/24 0044  LIPASE 403*   No results for input(s): AMMONIA in the last 168 hours. Coagulation Profile: No results for input(s): INR, PROTIME in the last 168 hours. Cardiac Enzymes: No results for input(s): CKTOTAL, CKMB, CKMBINDEX, TROPONINI in the last 168 hours. BNP (last 3 results) No results for input(s): PROBNP in the last 8760 hours. HbA1C: No results for input(s): HGBA1C in the last 72 hours. CBG: Recent Labs  Lab 05/18/24 1001 05/18/24 1109 05/18/24 1214 05/18/24 1414 05/18/24 1753  GLUCAP 138* 182* 179* 202* 216*   Lipid Profile: Recent Labs    05/16/24 1306 05/16/24 1812 05/17/24 1520 05/18/24 0309  CHOL 301*  --   --   --   HDL 17*  --   --   --   LDLCALC 122  --   --   --   TRIG 2,027*   < > 598* 370*  CHOLHDL 17.5  --   --   --   LDLDIRECT 26  --   --   --    < > = values in this interval not displayed.   Thyroid  Function Tests: No results for input(s): TSH, T4TOTAL, FREET4, T3FREE, THYROIDAB in the last 72 hours. Anemia Panel: No results for input(s): VITAMINB12, FOLATE, FERRITIN, TIBC, IRON, RETICCTPCT in the last 72 hours. Urine analysis:    Component Value Date/Time   COLORURINE YELLOW (A) 05/15/2024 2327   APPEARANCEUR CLEAR (A) 05/15/2024 2327   LABSPEC 1.040 (H) 05/15/2024 2327   PHURINE 6.0 05/15/2024 2327   GLUCOSEU  >=500 (A) 05/15/2024 2327   HGBUR NEGATIVE 05/15/2024 2327   BILIRUBINUR NEGATIVE 05/15/2024 2327   KETONESUR 80 (A) 05/15/2024 2327   PROTEINUR >=300 (A) 05/15/2024 2327   NITRITE NEGATIVE 05/15/2024 2327   LEUKOCYTESUR NEGATIVE 05/15/2024 2327   Sepsis Labs: @LABRCNTIP (procalcitonin:4,lacticidven:4)  ) Recent Results (from the past 240 hours)  MRSA Next Gen by PCR, Nasal     Status: None   Collection Time: 05/16/24  6:39 PM   Specimen: Nasal Mucosa; Nasal Swab  Result Value Ref Range Status   MRSA by  PCR Next Gen NOT DETECTED NOT DETECTED Final    Comment: (NOTE) The GeneXpert MRSA Assay (FDA approved for NASAL specimens only), is one component of a comprehensive MRSA colonization surveillance program. It is not intended to diagnose MRSA infection nor to guide or monitor treatment for MRSA infections. Test performance is not FDA approved in patients less than 67 years old. Performed at [facility], [address] [place], [place] [address]          Radiology Studies: ECHOCARDIOGRAM COMPLETE Result Date: 05/17/2024    ECHOCARDIOGRAM REPORT   Patient Name:   Aaron Parker Date of Exam: 05/17/2024 Medical Rec #:  [id number]         Height:       70.0 in Accession #:    [id number]        Weight:       219.4 lb Date of Birth:  2000-12-07          BSA:          2.171 m Patient Age:    23 years          BP:           116/73 mmHg Patient Gender: M                 HR:           113 bpm. Exam Location:  [facility] Procedure: 2D Echo, Cardiac Doppler and Color Doppler (Both Spectral and Color            Flow Doppler were utilized during procedure). Indications:     Chest Pain R07.9  History:         Patient has no prior history of Echocardiogram examinations.                  Signs/Symptoms:Chest Pain.  Sonographer:     Ashley McNeely-Sloane Referring Phys:  [id number] [facility] PAUDEL Diagnosing Phys: Lonni Hanson MD IMPRESSIONS  1. Left ventricular ejection fraction, by estimation, is  55 to 60%. The left ventricle has normal function. The left ventricle has no regional wall motion abnormalities. Indeterminate diastolic filling due to E-A fusion.  2. Right ventricular systolic function is normal. The right ventricular size is normal. Tricuspid regurgitation signal is inadequate for assessing PA pressure.  3. The mitral valve is normal in structure. Trivial mitral valve regurgitation. No evidence of mitral stenosis.  4. The aortic valve is tricuspid. Aortic valve regurgitation is not visualized. No aortic stenosis is present. FINDINGS  Left Ventricle: Left ventricular ejection fraction, by estimation, is 55 to 60%. The left ventricle has normal function. The left ventricle has no regional wall motion abnormalities. The left ventricular internal cavity size was normal in size. There is  no left ventricular hypertrophy. Indeterminate diastolic filling due to E-A fusion. Right Ventricle: The right ventricular size is normal. No increase in right ventricular wall thickness. Right ventricular systolic function is normal. Tricuspid regurgitation signal is inadequate for assessing PA pressure. Left Atrium: Left atrial size was normal in size. Right Atrium: Right atrial size was normal in size. Pericardium: The pericardium was not well visualized. Mitral Valve: The mitral valve is normal in structure. Trivial mitral valve regurgitation. No evidence of mitral valve stenosis. MV peak gradient, 7.1 mmHg. The mean mitral valve gradient is 3.0 mmHg. Tricuspid Valve: The tricuspid valve is grossly normal. Tricuspid valve regurgitation is not demonstrated. Aortic Valve: The aortic valve is tricuspid. Aortic valve regurgitation is not visualized. No  aortic stenosis is present. Aortic valve mean gradient measures 4.0 mmHg. Aortic valve peak gradient measures 8.9 mmHg. Aortic valve area, by VTI measures 3.05 cm. Pulmonic Valve: The pulmonic valve was normal in structure. Pulmonic valve regurgitation is not  visualized. No evidence of pulmonic stenosis. Aorta: The aortic root and ascending aorta are structurally normal, with no evidence of dilitation. Pulmonary Artery: The pulmonary artery is of normal size. IAS/Shunts: The interatrial septum was not well visualized.  LEFT VENTRICLE PLAX 2D LVIDd:         4.60 cm      Diastology LVIDs:         1.60 cm      LV e' medial:    16.20 cm/s LV PW:         1.00 cm      LV E/e' medial:  6.9 LV IVS:        1.00 cm      LV e' lateral:   13.50 cm/s LVOT diam:     2.30 cm      LV E/e' lateral: 8.3 LV SV:         70 LV SV Index:   32 LVOT Area:     4.15 cm  LV Volumes (MOD) LV vol d, MOD A2C: 95.8 ml LV vol d, MOD A4C: 108.0 ml LV vol s, MOD A2C: 45.0 ml LV vol s, MOD A4C: 61.5 ml LV SV MOD A2C:     50.8 ml LV SV MOD A4C:     108.0 ml LV SV MOD BP:      48.3 ml RIGHT VENTRICLE RV Basal diam:  4.10 cm RV Mid diam:    3.10 cm RV S prime:     16.20 cm/s TAPSE (M-mode): 2.6 cm LEFT ATRIUM             Index        RIGHT ATRIUM           Index LA diam:        3.70 cm 1.70 cm/m   RA Area:     16.50 cm LA Vol (A2C):   43.5 ml 20.04 ml/m  RA Volume:   46.90 ml  21.60 ml/m LA Vol (A4C):   33.7 ml 15.52 ml/m LA Biplane Vol: 39.1 ml 18.01 ml/m  AORTIC VALVE                    PULMONIC VALVE AV Area (Vmax):    3.10 cm     PV Vmax:        1.08 m/s AV Area (Vmean):   3.30 cm     PV Vmean:       78.500 cm/s AV Area (VTI):     3.05 cm     PV VTI:         0.170 m AV Vmax:           149.00 cm/s  PV Peak grad:   4.7 mmHg AV Vmean:          96.500 cm/s  PV Mean grad:   3.0 mmHg AV VTI:            0.230 m      RVOT Peak grad: 2 mmHg AV Peak Grad:      8.9 mmHg AV Mean Grad:      4.0 mmHg LVOT Vmax:         111.00 cm/s LVOT Vmean:        76.700  cm/s LVOT VTI:          0.169 m LVOT/AV VTI ratio: 0.73  AORTA Ao Root diam: 3.10 cm Ao Asc diam:  3.00 cm MITRAL VALVE MV Area (PHT): 6.12 cm     SHUNTS MV Area VTI:   2.77 cm     Systemic VTI:  0.17 m MV Peak grad:  7.1 mmHg     Systemic Diam: 2.30 cm  MV Mean grad:  3.0 mmHg     Pulmonic VTI:  0.110 m MV Vmax:       1.33 m/s MV Vmean:      84.9 cm/s MV Decel Time: 124 msec MV E velocity: 112.00 cm/s Lonni Hanson MD Electronically signed by Lonni Hanson MD Signature Date/Time: 05/17/2024/1:19:56 PM    Final         Scheduled Meds:  enoxaparin  (LOVENOX ) injection  50 mg Subcutaneous Q24H   gemfibrozil   600 mg Oral BID AC    HYDROmorphone  (DILAUDID ) injection  0.5 mg Intravenous Once   insulin  aspart  0-15 Units Subcutaneous Q4H   insulin  glargine-yfgn  5 Units Subcutaneous Q2200   ketorolac   15 mg Intravenous Q6H   [START ON 05/19/2024] multivitamin with minerals  1 tablet Oral Daily   pantoprazole   40 mg Oral Daily   Ensure Max Protein  11 oz Oral BID   senna-docusate  1 tablet Oral BID   [START ON 05/19/2024] thiamine  100 mg Oral Daily   Continuous Infusions:     LOS: 2 days     Alban Pepper, MD [facility]   If 7PM-7AM, please contact night-coverage [email or web address] Password [facility] 05/18/2024, 6:24 PM

## 2024-05-18 NOTE — Plan of Care (Signed)
 Nutrition Education Note   RD consulted for nutrition education regarding diabetes.   Lab Results  Component Value Date   HGBA1C 10.7 (H) 06/19/2023    RD provided Nutrition and Type II Diabetes handout from the Academy of Nutrition and Dietetics. Discussed different food groups and their effects on blood sugar, emphasizing carbohydrate-containing foods. Provided list of carbohydrates and recommended serving sizes of common foods.  Discussed importance of controlled and consistent carbohydrate intake throughout the day. Provided examples of ways to balance meals/snacks and encouraged intake of high-fiber, whole grain complex carbohydrates. Teach back method used.  RD provided Nutrition Therapy with Pancreatitis handout from the Academy of Nutrition and Dietetics. RD also provided a list of low fat vs high fat foods. Reviewed patient's dietary recall. Advised patient to eat multiple small meals and to avoid high fat foods, spicy foods, and alcohol. Educated pt on the importance of adequate protein intake needed to preserve lean muscle. Gave patient examples of meals and menu planning.    Teach back method used.  Expect fair compliance.  RD following this patient   Augustin Shams MS, RD, LDN If unable to be reached, please send secure chat to RD inpatient available from 8:00a-4:00p daily

## 2024-05-18 NOTE — TOC CM/SW Note (Signed)
 Transition of Care [facility]) - Inpatient Brief Assessment   Patient Details  Name: Aaron Parker MRN: [id number] Date of Birth: 2000/12/06  Transition of Care [facility]) CM/SW Contact:    Lauraine JAYSON Carpen, LCSW Phone Number: 05/18/2024, 2:28 PM   Clinical Narrative: CSW reviewed chart. SDOH flag for food insecurity. Resources added to AVS. No insurance. Sent secure chat to pharmacist to notify. CSW will continue to follow progress. Please place [facility] consult if any needs arise.  Transition of Care Asessment: Insurance and Status: Selfpay Patient has primary care physician: Yes Home environment has been reviewed: Single family home Prior level of function:: Not documented Prior/Current Home Services: No current home services Social Drivers of Health Review: SDOH reviewed interventions complete Readmission risk has been reviewed: Yes Transition of care needs: no transition of care needs at this time

## 2024-05-18 NOTE — Inpatient Diabetes Management (Addendum)
 Inpatient Diabetes Program Recommendations  AACE/ADA: New Consensus Statement on Inpatient Glycemic Control  Target Ranges:  Prepandial:   less than 140 mg/dL      Peak postprandial:   less than 180 mg/dL (1-2 hours)      Critically ill patients:  140 - 180 mg/dL    Latest Reference Range & Units 05/18/24 02:25 05/18/24 04:00 05/18/24 05:02 05/18/24 06:21 05/18/24 07:08 05/18/24 08:03 05/18/24 08:59 05/18/24 10:01  Glucose-Capillary 70 - 99 mg/dL 881 (H) 896 (H) 896 (H) 104 (H) 91 96 97 138 (H)   Review of Glycemic Control  Diabetes history: DM2 Outpatient Diabetes medications: Tresiba  40 units daily, Metformin  XR 750 mg QAM, Victoza  0.6 mg daily (not taken any DM meds since March 2025) Current orders for Inpatient glycemic control: None   Inpatient Diabetes Program Recommendations:     Insulin : Please consider ordering insulin  glargine 20 units Q24h, CBGs Q4H, and Novolog  0-15 units Q4H. Once diet is advanced, may need to add Novolog  meal coverage insulin .   Outpatient: Since patient has pancreatitis, would recommend to not use GLP-1 medications due to increased risk of pancreatitis. Not able to verify any active insurance, will need to get meds from [facility] outpatient [facility] and they have Humalog 75/25 insulin  pens (#[phone]), insulin  pen needles [phone]).  Would recommend discharging on Humalog 75/25 15 units BID (would provide a total of 22.5 units for basal and 7.5 units for meal coverage per day).   Addendum 05/18/24@11 :15-Communicated with RN to inquire about IV insulin  drip and RN reports that IV insulin  was stopped at 8:33 am today. No SQ orders for insulin  currently ordered. Will send chat message to Dr. Franchot regarding ordering SQ insulin .   Thanks, Earnie Gainer, RN, MSN, CDCES Diabetes Coordinator Inpatient Diabetes Program [phone] (Team Pager from 8am to 5pm)

## 2024-05-18 NOTE — Discharge Instructions (Signed)
 Please take your insulin  as prescribed along with your metformin . It will be important to see your primary care doctor to get these medicines adjusted. Please recorded your blood sugars as taught to you by our diabetic team (before breakfast, lunch and dinner). If your blood sugar is <70, a document is attached for guidance on how to treat a LOW blood sugar.    Please take your iron tablets and follow up with your PCP for further work up of this. You may need a stool softener to prevent constipation while on this medicine.   Please follow up with the lipid specialist at [place] Dr. Mona or Dr Okey Pesa at [facility]. Please also adhere to your low fat diet until you follow up with your Doctors outside of the hospital.    Food Resources  Agency Name: [facility] Agency Address: [address] [place], [place] [address] Phone: [phone] Website: [email or web address] Service(s) Offered: Housing services, self-sufficiency, congregate meal program, weatherization program, event organiser program, emergency food assistance,  housing counseling, home ownership program, wheels - to work program.  [employer] free for 60 and older at various locations from usaa, Monday-Friday:  [employer], [address] [place], [phone] -[facility], [address] [place] [phone]  -[facility], [address], [place] [phone]  -[address], [place], [phone]  Agency Name: [facility] Address: [phone] [address], [place], [place] [address] Phone: [phone] Website: [email or web address] Service(s) Offered: Home delivered hot, frozen, and emergency  meals. Grocery assistance program which matches  volunteers one-on-one with seniors unable to grocery shop  for themselves. Must be 60 years and older; less than 20  hours of in-home aide service, limited or no  driving ability;  live alone or with someone with a disability; live in  [address].  Agency Name: [employer] [facility] [employer]) Address: [address] [place], [place] [address] Phone: [phone] Service(s) Offered: Food is served to shut-ins, homeless, elderly, and low income people in the community every Saturday (11:30 am-12:30 pm) and Sunday (12:30 pm-1:30pm). Volunteers also offer help and encouragement in seeking employment,  and spiritual guidance.  Agency Name: [employer] Address: [address] [place], [place] [address] Phone: [phone] Service(s) Offered: Child support services; child welfare services; food stamps; Medicaid; work first family assistance; and aid with fuel,  rent, food and medicine.  Agency Name: [employer] Address: [address] [place], [place] Phone: [phone] Website: [email or web address] Services Offered: Monday 10:00am-12:00, 8:00pm-9:00pm, and Friday 10:00am-12:00.  Agency Name: [employer] Address: [address], [place], [place] [address] Phone: [phone] Website: [email or web address] Service(s) Offered: Serves weekday meals, open from 11:30 am- 1:00 pm., and 6:30-7:30pm, Monday-Wednesday-Friday distributes food 3:30-6pm, Monday-Wednesday-Friday.  Agency Name: [facility] Address: [address], [place], [place] Phone: [phone] Website: [email or web address] Services Offered: Distributes food the 4th Saturday of the month, starting at 8:00 am  Agency Name: [facility] Address: [phone] [address] [place], [place] [address] Phone: [phone] Website: [email or web address] Service(s) Offered: [employer] life, weekly food pantry. Open Wednesdays from 10:00am-noon.  Agency Name: [facility] [employer] Address: [address] [place], [place], [place] Phone: [phone] Services  Offered: Distributes food 9am-1pm, Monday-Thursday. Call for details.  Agency Name: [facility] Address: [address] [place], [place] [address] Phone: [phone] Website: [email or web address] Service(s) Offered: Games Developer. Call for assistance.  Agency Name: [facility]  [employer] [facility] Address: [address], [place], [place] [address] Phone: [phone] Service Offered: Emergency Food Pantry. Call for appointment.  Agency Name: [facility] Address: [address], [place], [place] [address] Phone: [phone] Website: [email or web address] Services Offered: Games Developer. Call for details  Agency Name: [facility] Address: [address] [place], [place] Phone: [phone] Website: [email or web address] Service(s) Offered: Emergency Food Pantry. Call for details.  Agency Name: [employer] Address: [address], [place], [place] [address] Phone: [phone] or [phone] Website: [email or web address] Service(s) Offered: Distribute food 9am-11:30 am, Tuesday-Friday, and 1-3:30pm, Monday-Friday. Food pantry Monday-Friday 1pm-3pm, fresh items, Mon.-Wed.-Fri.  Agency Name: [facility] Address: [address] [place], [place] [address] Phone: [phone] Website: [email or web address] Services Offered: Distribute food Tues and Sats from 9:00am-noon. Closed 1st Saturday of each month. Call for details  Agency Name: [employer] Address: Fayrene Boatman [facility] [address] [place] [address] Phone: [phone]  Services Offered: Delivers meals every Thursday

## 2024-05-19 ENCOUNTER — Inpatient Hospital Stay: Payer: Self-pay

## 2024-05-19 LAB — BASIC METABOLIC PANEL WITH GFR
Anion gap: 12 (ref 5–15)
BUN: 14 mg/dL (ref 6–20)
CO2: 25 mmol/L (ref 22–32)
Calcium: 9.3 mg/dL (ref 8.9–10.3)
Chloride: 97 mmol/L — ABNORMAL LOW (ref 98–111)
Creatinine, Ser: 0.76 mg/dL (ref 0.61–1.24)
GFR, Estimated: >60 mL/min (ref >60)
Glucose, Bld: 171 mg/dL — ABNORMAL HIGH (ref 70–99)
Potassium: 3.6 mmol/L (ref 3.5–5.1)
Sodium: 134 mmol/L — ABNORMAL LOW (ref 135–145)

## 2024-05-19 LAB — MAGNESIUM: Magnesium: 2 mg/dL (ref 1.7–2.4)

## 2024-05-19 LAB — GLUCOSE, CAPILLARY
Glucose-Capillary: 199 mg/dL — ABNORMAL HIGH (ref 70–99)
Glucose-Capillary: 169 mg/dL — ABNORMAL HIGH (ref 70–99)
Glucose-Capillary: 174 mg/dL — ABNORMAL HIGH (ref 70–99)
Glucose-Capillary: 179 mg/dL — ABNORMAL HIGH (ref 70–99)
Glucose-Capillary: 236 mg/dL — ABNORMAL HIGH (ref 70–99)
Glucose-Capillary: 271 mg/dL — ABNORMAL HIGH (ref 70–99)

## 2024-05-19 LAB — PHOSPHORUS: Phosphorus: 4.1 mg/dL (ref 2.5–4.6)

## 2024-05-19 MED ORDER — INSULIN GLARGINE 100 UNIT/ML ~~LOC~~ SOLN: 10.0000 [IU] | Freq: Every day | SUBCUTANEOUS | Status: DC

## 2024-05-19 MED ORDER — IOHEXOL 9 MG/ML PO SOLN
500.0000 mL | ORAL | Status: AC
  Administered 2024-05-19 (×2): 500 mL via ORAL

## 2024-05-19 MED ORDER — INSULIN GLARGINE-YFGN 100 UNIT/ML ~~LOC~~ SOLN: 10.0000 [IU] | Freq: Every day | SUBCUTANEOUS | Status: DC

## 2024-05-19 MED ORDER — IOHEXOL 300 MG/ML  SOLN
100.0000 mL | Freq: Once | INTRAMUSCULAR | Status: AC | PRN
  Administered 2024-05-19: 100 mL via INTRAVENOUS

## 2024-05-19 MED ORDER — INSULIN ASPART 100 UNIT/ML IJ SOLN
0.0000 [IU] | Freq: Three times a day (TID) | INTRAMUSCULAR | Status: DC
  Administered 2024-05-19: 3 [IU] via SUBCUTANEOUS
  Administered 2024-05-19: 5 [IU] via SUBCUTANEOUS
  Administered 2024-05-20 (×2): 2 [IU] via SUBCUTANEOUS
  Filled 2024-05-19: qty 3
  Filled 2024-05-19: qty 5
  Filled 2024-05-19 (×2): qty 2

## 2024-05-19 MED ORDER — INSULIN ASPART PROT & ASPART (70-30 MIX) 100 UNIT/ML ~~LOC~~ SUSP
15.0000 [IU] | Freq: Two times a day (BID) | SUBCUTANEOUS | Status: DC
  Administered 2024-05-19 (×2): 15 [IU] via SUBCUTANEOUS
  Filled 2024-05-19: qty 10

## 2024-05-19 MED ORDER — INSULIN ASPART 100 UNIT/ML IJ SOLN: 3.0000 [IU] | Freq: Three times a day (TID) | INTRAMUSCULAR | Status: DC

## 2024-05-19 MED ORDER — INSULIN ASPART 100 UNIT/ML IJ SOLN
3.0000 [IU] | Freq: Once | INTRAMUSCULAR | Status: AC
  Administered 2024-05-19: 3 [IU] via SUBCUTANEOUS
  Filled 2024-05-19: qty 3

## 2024-05-19 NOTE — Plan of Care (Signed)

## 2024-05-19 NOTE — Progress Notes (Signed)
 PROGRESS NOTE    Aaron Parker  FMW:[id number] DOB: [birth year] DOA: 05/15/2024 PCP: Sharma Coyer, MD  Outpatient Specialists: cardiology    Brief Narrative:   From admission h and p  Aaron Parker is a pleasant 23 y.o. male with medical history significant for type 2 diabetes not on any medications for several months due to insurance issues, chronic abdominal pain, GERD, Wolff-Parkinson-White syndrome not on any medications who came into ED complaining of abdominal pain started 4 PM  yesterday.  Patient is stated that his pain was severe at 9.5/10, left upper quadrant and epigastric region, associated with nausea and 1 episode of vomiting which contained food material.  He denied any hematemesis or melena.  He denied any fever, chills, chest pain, cough, shortness of breath, palpitations.  He denies any trauma to his abdomen.  He denies any alcohol drinking and history of gallbladder stones.  He denies any testicular pain or GU symptoms.   Assessment & Plan:   Principal Problem:   DKA (diabetic ketoacidosis) (HCC) Active Problems:   MDD (major depressive disorder)   GERD (gastroesophageal reflux disease)   Chronic abdominal pain   Type 2 diabetes mellitus with diabetic polyneuropathy, with long-term current use of insulin  (HCC)   Pancreatitis, acute   Hyperglycemia   WPW (Wolff-Parkinson-White syndrome)  # Acute pancreatitis 2/2 hypertriglyceridemia. No stone or biliary duct dilation on CT. No N/V, having mild abdominal pain that is in the left upper quadrant. -Will repeat CT scan of the abdomen and pelvis to see if complication of pancreatitis given persistent left upper quadrant abdominal pain. - Continue low fat reg diet   # Severe hypertriglyceridemia Likely 2/2 uncontrolled DM. Greater than 2000 on arrival, <500 today.  -Continue subcutaneous insulin  and fat restricted diet  - consider referral to lipid specialist on discharge. Per [facility] cardiology, can see  Dr. Chad Hilty in [place] or Dr. Okey Pesa at [facility]  # T2DM, uncontrolled # DKA A1c 10.7.  Off meds 9 months as lost insurance - dm educator recs appreciated, will start 75/25 when off iv insulin  -Avoid glp1s given h/o pancreatitis  -Transitioned to 70/30 while inpatient   Microcytosis without anemia  Will check iron panel.    # History WPW Noted  # Obesity noted   DVT prophylaxis: lovenox  Code Status: full Family Communication: partner at bedside  Level of care: Med-Surg Status is: Inpatient Remains inpatient appropriate because: severity of illness    Consultants:  Pccm (signed off)  Procedures: none  Antimicrobials:  none    Subjective: Having some LUQ pain. Having bowel movements.   Objective: Vitals:   05/18/24 2333 05/19/24 0424 05/19/24 0500 05/19/24 0841  BP: 124/68 133/77  (!) 131/93  Pulse: (!) 108 100  (!) 108  Resp: 17 18  16   Temp: 99.6 F (37.6 C) 98.3 F (36.8 C)  98.1 F (36.7 C)  TempSrc:      SpO2: 95% 97%  95%  Weight:   97.5 kg   Height:        Intake/Output Summary (Last 24 hours) at 05/19/2024 1005 Last data filed at 05/19/2024 0944 Gross per 24 hour  Intake 2243.94 ml  Output --  Net 2243.94 ml   Filed Weights   05/15/24 2121 05/16/24 1705 05/19/24 0500  Weight: 106.6 kg 99.5 kg 97.5 kg    Examination:  Physical Exam  Constitutional: In no distress.  Cardiovascular: Normal rate, regular rhythm. No lower extremity edema  Pulmonary: Non labored breathing on room  air, no wheezing or rales.   Abdominal: Soft. Non distended TTP in LUQ, mild  Musculoskeletal: Normal range of motion.     Neurological: Alert and oriented to person, place, and time. Non focal  Skin: Skin is warm and dry.      Data Reviewed: I have personally reviewed following labs and imaging studies  CBC: Recent Labs  Lab 05/15/24 2125 05/16/24 0522 05/16/24 2131 05/17/24 0132  WBC 9.8 9.2 9.3 11.2*  NEUTROABS  --  6.1 6.8 7.9*  HGB  14.3 13.6 13.8 12.3*  HCT 38.8* 37.0* 39.4 34.6*  MCV 77.4* 80.3 77.3* 75.7*  PLT 363 281 215 208   Basic Metabolic Panel: Recent Labs  Lab 05/16/24 2131 05/17/24 0132 05/17/24 0318 05/18/24 0309 05/19/24 0420  NA 133* 135 134* 135 134*  K 3.3* 3.1* 3.5 3.6 3.6  CL 95* 99 100 101 97*  CO2 25 26 23 24 25   GLUCOSE 141* 100* 123* 99 171*  BUN 6 7 7  <5* 14  CREATININE 0.56* 0.72 0.63 0.69 0.76  CALCIUM 9.4 9.4 9.4 9.8 9.3  MG  --   --  2.0  --  2.0  PHOS  --   --  2.4*  --  4.1   GFR: Estimated Creatinine Clearance: 165.3 mL/min (by C-G formula based on SCr of 0.76 mg/dL). Liver Function Tests: Recent Labs  Lab 05/16/24 0044 05/16/24 2131  AST 79* 18  ALT 50* 35  ALKPHOS 53 57  BILITOT 0.7 0.8  PROT 7.6 6.7  ALBUMIN 4.3 4.1   Recent Labs  Lab 05/16/24 0044  LIPASE 403*   No results for input(s): AMMONIA in the last 168 hours. Coagulation Profile: No results for input(s): INR, PROTIME in the last 168 hours. Cardiac Enzymes: No results for input(s): CKTOTAL, CKMB, CKMBINDEX, TROPONINI in the last 168 hours. BNP (last 3 results) No results for input(s): PROBNP in the last 8760 hours. HbA1C: No results for input(s): HGBA1C in the last 72 hours. CBG: Recent Labs  Lab 05/18/24 2044 05/18/24 2331 05/19/24 0122 05/19/24 0422 05/19/24 0829  GLUCAP 205* 192* 199* 169* 174*   Lipid Profile: Recent Labs    05/16/24 1306 05/16/24 1812 05/17/24 1520 05/18/24 0309  CHOL 301*  --   --   --   HDL 17*  --   --   --   LDLCALC 122  --   --   --   TRIG 2,027*   < > 598* 370*  CHOLHDL 17.5  --   --   --   LDLDIRECT 26  --   --   --    < > = values in this interval not displayed.   Thyroid  Function Tests: No results for input(s): TSH, T4TOTAL, FREET4, T3FREE, THYROIDAB in the last 72 hours. Anemia Panel: No results for input(s): VITAMINB12, FOLATE, FERRITIN, TIBC, IRON, RETICCTPCT in the last 72 hours. Urine analysis:     Component Value Date/Time   COLORURINE YELLOW (A) 05/15/2024 2327   APPEARANCEUR CLEAR (A) 05/15/2024 2327   LABSPEC 1.040 (H) 05/15/2024 2327   PHURINE 6.0 05/15/2024 2327   GLUCOSEU >=500 (A) 05/15/2024 2327   HGBUR NEGATIVE 05/15/2024 2327   BILIRUBINUR NEGATIVE 05/15/2024 2327   KETONESUR 80 (A) 05/15/2024 2327   PROTEINUR >=300 (A) 05/15/2024 2327   NITRITE NEGATIVE 05/15/2024 2327   LEUKOCYTESUR NEGATIVE 05/15/2024 2327   Sepsis Labs: @LABRCNTIP (procalcitonin:4,lacticidven:4)  ) Recent Results (from the past 240 hours)  MRSA Next Gen by PCR, Nasal  Status: None   Collection Time: 05/16/24  6:39 PM   Specimen: Nasal Mucosa; Nasal Swab  Result Value Ref Range Status   MRSA by PCR Next Gen NOT DETECTED NOT DETECTED Final    Comment: (NOTE) The GeneXpert MRSA Assay (FDA approved for NASAL specimens only), is one component of a comprehensive MRSA colonization surveillance program. It is not intended to diagnose MRSA infection nor to guide or monitor treatment for MRSA infections. Test performance is not FDA approved in patients less than 54 years old. Performed at [facility], [address] [place], [place] [address]          Radiology Studies: ECHOCARDIOGRAM COMPLETE Result Date: 05/17/2024    ECHOCARDIOGRAM REPORT   Patient Name:   Aaron Parker Date of Exam: 05/17/2024 Medical Rec #:  [id number]         Height:       70.0 in Accession #:    [id number]        Weight:       219.4 lb Date of Birth:  [birth year]          BSA:          2.171 m Patient Age:    23 years          BP:           116/73 mmHg Patient Gender: M                 HR:           113 bpm. Exam Location:  [facility] Procedure: 2D Echo, Cardiac Doppler and Color Doppler (Both Spectral and Color            Flow Doppler were utilized during procedure). Indications:     Chest Pain R07.9  History:         Patient has no prior history of Echocardiogram examinations.                  Signs/Symptoms:Chest  Pain.  Sonographer:     Ashley McNeely-Sloane Referring Phys:  [id number] [facility] PAUDEL Diagnosing Phys: Lonni Hanson MD IMPRESSIONS  1. Left ventricular ejection fraction, by estimation, is 55 to 60%. The left ventricle has normal function. The left ventricle has no regional wall motion abnormalities. Indeterminate diastolic filling due to E-A fusion.  2. Right ventricular systolic function is normal. The right ventricular size is normal. Tricuspid regurgitation signal is inadequate for assessing PA pressure.  3. The mitral valve is normal in structure. Trivial mitral valve regurgitation. No evidence of mitral stenosis.  4. The aortic valve is tricuspid. Aortic valve regurgitation is not visualized. No aortic stenosis is present. FINDINGS  Left Ventricle: Left ventricular ejection fraction, by estimation, is 55 to 60%. The left ventricle has normal function. The left ventricle has no regional wall motion abnormalities. The left ventricular internal cavity size was normal in size. There is  no left ventricular hypertrophy. Indeterminate diastolic filling due to E-A fusion. Right Ventricle: The right ventricular size is normal. No increase in right ventricular wall thickness. Right ventricular systolic function is normal. Tricuspid regurgitation signal is inadequate for assessing PA pressure. Left Atrium: Left atrial size was normal in size. Right Atrium: Right atrial size was normal in size. Pericardium: The pericardium was not well visualized. Mitral Valve: The mitral valve is normal in structure. Trivial mitral valve regurgitation. No evidence of mitral valve stenosis. MV peak gradient, 7.1 mmHg. The mean mitral valve gradient is 3.0 mmHg. Tricuspid  Valve: The tricuspid valve is grossly normal. Tricuspid valve regurgitation is not demonstrated. Aortic Valve: The aortic valve is tricuspid. Aortic valve regurgitation is not visualized. No aortic stenosis is present. Aortic valve mean gradient measures 4.0 mmHg.  Aortic valve peak gradient measures 8.9 mmHg. Aortic valve area, by VTI measures 3.05 cm. Pulmonic Valve: The pulmonic valve was normal in structure. Pulmonic valve regurgitation is not visualized. No evidence of pulmonic stenosis. Aorta: The aortic root and ascending aorta are structurally normal, with no evidence of dilitation. Pulmonary Artery: The pulmonary artery is of normal size. IAS/Shunts: The interatrial septum was not well visualized.  LEFT VENTRICLE PLAX 2D LVIDd:         4.60 cm      Diastology LVIDs:         1.60 cm      LV e' medial:    16.20 cm/s LV PW:         1.00 cm      LV E/e' medial:  6.9 LV IVS:        1.00 cm      LV e' lateral:   13.50 cm/s LVOT diam:     2.30 cm      LV E/e' lateral: 8.3 LV SV:         70 LV SV Index:   32 LVOT Area:     4.15 cm  LV Volumes (MOD) LV vol d, MOD A2C: 95.8 ml LV vol d, MOD A4C: 108.0 ml LV vol s, MOD A2C: 45.0 ml LV vol s, MOD A4C: 61.5 ml LV SV MOD A2C:     50.8 ml LV SV MOD A4C:     108.0 ml LV SV MOD BP:      48.3 ml RIGHT VENTRICLE RV Basal diam:  4.10 cm RV Mid diam:    3.10 cm RV S prime:     16.20 cm/s TAPSE (M-mode): 2.6 cm LEFT ATRIUM             Index        RIGHT ATRIUM           Index LA diam:        3.70 cm 1.70 cm/m   RA Area:     16.50 cm LA Vol (A2C):   43.5 ml 20.04 ml/m  RA Volume:   46.90 ml  21.60 ml/m LA Vol (A4C):   33.7 ml 15.52 ml/m LA Biplane Vol: 39.1 ml 18.01 ml/m  AORTIC VALVE                    PULMONIC VALVE AV Area (Vmax):    3.10 cm     PV Vmax:        1.08 m/s AV Area (Vmean):   3.30 cm     PV Vmean:       78.500 cm/s AV Area (VTI):     3.05 cm     PV VTI:         0.170 m AV Vmax:           149.00 cm/s  PV Peak grad:   4.7 mmHg AV Vmean:          96.500 cm/s  PV Mean grad:   3.0 mmHg AV VTI:            0.230 m      RVOT Peak grad: 2 mmHg AV Peak Grad:      8.9 mmHg AV Mean Grad:  4.0 mmHg LVOT Vmax:         111.00 cm/s LVOT Vmean:        76.700 cm/s LVOT VTI:          0.169 m LVOT/AV VTI ratio: 0.73  AORTA Ao  Root diam: 3.10 cm Ao Asc diam:  3.00 cm MITRAL VALVE MV Area (PHT): 6.12 cm     SHUNTS MV Area VTI:   2.77 cm     Systemic VTI:  0.17 m MV Peak grad:  7.1 mmHg     Systemic Diam: 2.30 cm MV Mean grad:  3.0 mmHg     Pulmonic VTI:  0.110 m MV Vmax:       1.33 m/s MV Vmean:      84.9 cm/s MV Decel Time: 124 msec MV E velocity: 112.00 cm/s Lonni Hanson MD Electronically signed by Lonni Hanson MD Signature Date/Time: 05/17/2024/1:19:56 PM    Final         Scheduled Meds:  enoxaparin  (LOVENOX ) injection  50 mg Subcutaneous Q24H   gemfibrozil   600 mg Oral BID AC    HYDROmorphone  (DILAUDID ) injection  0.5 mg Intravenous Once   insulin  aspart  0-15 Units Subcutaneous TID WC   insulin  aspart  3 Units Subcutaneous Once   insulin  aspart protamine- aspart  15 Units Subcutaneous BID WC   ketorolac   15 mg Intravenous Q6H   multivitamin with minerals  1 tablet Oral Daily   pantoprazole   40 mg Oral Daily   Ensure Max Protein  11 oz Oral BID   senna-docusate  1 tablet Oral BID   thiamine  100 mg Oral Daily   Continuous Infusions:     LOS: 3 days     Alban Pepper, MD [facility]   If 7PM-7AM, please contact night-coverage [email or web address] Password [email or web address] 05/19/2024, 10:05 AM

## 2024-05-19 NOTE — Inpatient Diabetes Management (Signed)
 Inpatient Diabetes Program Recommendations  AACE/ADA: New Consensus Statement on Inpatient Glycemic Control  Target Ranges:  Prepandial:   less than 140 mg/dL      Peak postprandial:   less than 180 mg/dL (1-2 hours)      Critically ill patients:  140 - 180 mg/dL    Latest Reference Range & Units 05/18/24 07:08 05/18/24 08:03 05/18/24 08:59 05/18/24 10:01 05/18/24 11:09 05/18/24 12:14 05/18/24 14:14 05/18/24 17:53 05/18/24 20:44 05/18/24 23:31 05/19/24 01:22 05/19/24 04:22  Glucose-Capillary 70 - 99 mg/dL 91 96 97 861 (H) 817 (H) 179 (H)  Semglee  10 units 202 (H)  Novolog  5 units 216 (H)  Novolog  5 units 205 (H)  Novolog  5 units 192 (H)  Semglee  5 units 199 (H)  Novolog  3 units 169 (H)  Novolog   3 units   Review of Glycemic Control  Diabetes history: DM2 Outpatient Diabetes medications: Tresiba  40 units daily, Metformin  XR 750 mg QAM, Victoza  0.6 mg daily (not taken any DM meds since March 2025) Current orders for Inpatient glycemic control: None   Inpatient Diabetes Program Recommendations:     Insulin : Patient given a total of Semglee  15 units on 12/10 and patient received a total of Novolog  21 units for correction over past 14 hours.  Since patient will need to be discharged on mixed insulin , please consider discontinuing Semglee  10 units QHS and Novolog  3 units TID and order 70/30 15 units BID to start at 8am this morning.   Outpatient: Since patient has pancreatitis, would recommend to not use GLP-1 medications due to increased risk of pancreatitis. Not able to verify any active insurance, will need to get med[facility] and they have Humalog 75/25 insulin  pens (#[id number]), insulin  pen needles [phone]).  Would recommend discharging on Humalog 75/25 15 units BID (would provide a total of 22.5 units for basal and 7.5 units for meal coverage per day).   Thanks, Earnie Gainer, RN, MSN, CDCES Diabetes Coordinator Inpatient Diabetes Program [phone]  (Team Pager from 8am to 5pm)

## 2024-05-20 ENCOUNTER — Other Ambulatory Visit: Payer: Self-pay

## 2024-05-20 LAB — CBC
HCT: 32.9 % — ABNORMAL LOW (ref 39.0–52.0)
Hemoglobin: 11.2 g/dL — ABNORMAL LOW (ref 13.0–17.0)
MCH: 26.8 pg (ref 26.0–34.0)
MCHC: 34 g/dL (ref 30.0–36.0)
MCV: 78.7 fL — ABNORMAL LOW (ref 80.0–100.0)
Platelets: 238 K/uL (ref 150–400)
RBC: 4.18 MIL/uL — ABNORMAL LOW (ref 4.22–5.81)
RDW: 13.6 % (ref 11.5–15.5)
WBC: 6.3 K/uL (ref 4.0–10.5)
nRBC: 0 % (ref 0.0–0.2)

## 2024-05-20 LAB — BASIC METABOLIC PANEL WITH GFR
Anion gap: 10 (ref 5–15)
BUN: 17 mg/dL (ref 6–20)
CO2: 26 mmol/L (ref 22–32)
Calcium: 9.7 mg/dL (ref 8.9–10.3)
Chloride: 99 mmol/L (ref 98–111)
Creatinine, Ser: 0.74 mg/dL (ref 0.61–1.24)
GFR, Estimated: >60 mL/min (ref >60)
Glucose, Bld: 186 mg/dL — ABNORMAL HIGH (ref 70–99)
Potassium: 3.8 mmol/L (ref 3.5–5.1)
Sodium: 135 mmol/L (ref 135–145)

## 2024-05-20 LAB — PHOSPHORUS: Phosphorus: 4 mg/dL (ref 2.5–4.6)

## 2024-05-20 LAB — IRON AND TIBC
Iron: 44 ug/dL — ABNORMAL LOW (ref 45–182)
Saturation Ratios: 15 % — ABNORMAL LOW (ref 17.9–39.5)
TIBC: 293 ug/dL (ref 250–450)
UIBC: 249 ug/dL

## 2024-05-20 LAB — FERRITIN: Ferritin: 514 ng/mL — ABNORMAL HIGH (ref 24–336)

## 2024-05-20 LAB — GLUTAMIC ACID DECARBOXYLASE AUTO ABS: Glutamic Acid Decarb Ab: <5 U/mL (ref 0.0–5.0)

## 2024-05-20 LAB — GLUCOSE, CAPILLARY
Glucose-Capillary: 180 mg/dL — ABNORMAL HIGH (ref 70–99)
Glucose-Capillary: 148 mg/dL — ABNORMAL HIGH (ref 70–99)

## 2024-05-20 LAB — TRIGLYCERIDES: Triglycerides: 283 mg/dL — ABNORMAL HIGH (ref <150)

## 2024-05-20 LAB — MAGNESIUM: Magnesium: 2.2 mg/dL (ref 1.7–2.4)

## 2024-05-20 MED ORDER — FERROUS SULFATE 325 (65 FE) MG PO TABS
325.0000 mg | ORAL_TABLET | Freq: Every day | ORAL | 0 refills | Status: AC
  Filled 2024-05-20: qty 30, 30d supply, fill #0

## 2024-05-20 MED ORDER — INSULIN LISPRO PROT & LISPRO (75-25 MIX) 100 UNIT/ML KWIKPEN
20.0000 [IU] | PEN_INJECTOR | Freq: Two times a day (BID) | SUBCUTANEOUS | 2 refills | Status: AC
  Filled 2024-05-20: qty 15, 38d supply, fill #0

## 2024-05-20 MED ORDER — FERROUS SULFATE 325 (65 FE) MG PO TABS
325.0000 mg | ORAL_TABLET | Freq: Every day | ORAL | 0 refills | Status: DC
  Filled 2024-05-20: qty 60, 60d supply, fill #0

## 2024-05-20 MED ORDER — GEMFIBROZIL 600 MG PO TABS
600.0000 mg | ORAL_TABLET | Freq: Two times a day (BID) | ORAL | 0 refills | Status: AC
  Filled 2024-05-20: qty 30, 15d supply, fill #0

## 2024-05-20 MED ORDER — INSULIN ASPART PROT & ASPART (70-30 MIX) 100 UNIT/ML ~~LOC~~ SUSP
20.0000 [IU] | Freq: Two times a day (BID) | SUBCUTANEOUS | Status: DC
  Administered 2024-05-20: 20 [IU] via SUBCUTANEOUS
  Filled 2024-05-20: qty 10

## 2024-05-20 MED ORDER — METFORMIN HCL ER 500 MG PO TB24
500.0000 mg | ORAL_TABLET | Freq: Two times a day (BID) | ORAL | 2 refills | Status: AC
  Filled 2024-05-20: qty 30, 15d supply, fill #0

## 2024-05-20 MED ORDER — INSULIN ASPART PROT & ASPART (70-30 MIX) 100 UNIT/ML ~~LOC~~ SUSP
20.0000 [IU] | Freq: Two times a day (BID) | SUBCUTANEOUS | 11 refills | Status: DC
  Filled 2024-05-20: qty 10, 25d supply, fill #0

## 2024-05-20 MED ORDER — INSULIN PEN NEEDLE 32G X 4 MM MISC
1.0000 | Freq: Two times a day (BID) | 2 refills | Status: AC
  Filled 2024-05-20: qty 100, 50d supply, fill #0

## 2024-05-20 MED ORDER — ACETAMINOPHEN 325 MG PO TABS: 650.0000 mg | ORAL_TABLET | Freq: Four times a day (QID) | ORAL | Status: AC | PRN

## 2024-05-20 NOTE — Plan of Care (Signed)

## 2024-05-20 NOTE — Progress Notes (Signed)
 Patient discharged home. All discharge needs met.

## 2024-05-20 NOTE — Inpatient Diabetes Management (Signed)
 Inpatient Diabetes Program Recommendations  AACE/ADA: New Consensus Statement on Inpatient Glycemic Control  Target Ranges:  Prepandial:   less than 140 mg/dL      Peak postprandial:   less than 180 mg/dL (1-2 hours)      Critically ill patients:  140 - 180 mg/dL    Latest Reference Range & Units 05/19/24 08:29 05/19/24 11:44 05/19/24 17:06 05/19/24 20:32 05/20/24 07:27  Glucose-Capillary 70 - 99 mg/dL 825 (H) 820 (H) 763 (H) 271 (H) 180 (H)   Review of Glycemic Control  Diabetes history: DM2 Outpatient Diabetes medications: Tresiba  40 units daily, Metformin  XR 750 mg QAM, Victoza  0.6 mg daily (not taken any DM meds since March 2025) Current orders for Inpatient glycemic control: 70/30 15 units BID, Novolog  0-15 units TID with meals; Metformin  XR 500 mg BID   Inpatient Diabetes Program Recommendations:     Insulin : Noted 70/30 increased from 15 units BID to 20 units BID and Metformin  added to start today.   Outpatient: Since patient has pancreatitis, would recommend to not use GLP-1 medications due to increased risk of pancreatitis. Not able to verify any active insurance, will need to get meds from [facility] outpatient [facility] pharmacy and they have Humalog 75/25 insulin  pens (#[phone]), insulin  pen needles [phone]).  Would recommend discharging on Humalog 75/25 18 units BID (would provide a total of 27 units for basal and 9 units for meal coverage per day).   Thanks, Earnie Gainer, RN, MSN, CDCES Diabetes Coordinator Inpatient Diabetes Program [phone] (Team Pager from 8am to 5pm)

## 2024-05-22 NOTE — Discharge Summary (Signed)
 Physician Discharge Summary   Patient: Aaron Parker MRN: [id number] DOB: [birth year]  Admit date:     05/15/2024  Discharge date: 05/20/2024  Discharge Physician: Alban Pepper   PCP: Sharma Coyer, MD   Recommendations at discharge:  {Tip this will not be part of the note when signed- Example include specific recommendations for outpatient follow-up, pending tests to follow-up on. (Optional):26781}  Needs iron deficiency further worked up Diabetes regimen will need to be titrated.   Discharge Diagnoses: Principal Problem:   DKA (diabetic ketoacidosis) (HCC) Active Problems:   MDD (major depressive disorder)   GERD (gastroesophageal reflux disease)   Chronic abdominal pain   Type 2 diabetes mellitus with diabetic polyneuropathy, with long-term current use of insulin  (HCC)   Pancreatitis, acute   Hyperglycemia   WPW (Wolff-Parkinson-White syndrome)  Resolved Problems:   * No resolved hospital problems. [facility] Course: No notes on file  Assessment and Plan: No notes have been filed under this hospital service. Service: Hospitalist     {Tip this will not be part of the note when signed Body mass index is 30.81 kg/m. ,  Nutrition Documentation    Flowsheet Row ED to Hosp-Admission (Discharged) from 05/15/2024 in [facility] (1A)  Nutrition Problem Inadequate oral intake  Etiology acute illness  Nutrition Goal Patient will meet greater than or equal to 90% of their needs  ,  (Optional):26781}  {(NOTE) Pain control PDMP Statment (Optional):26782} Consultants: *** Procedures performed: ***  Disposition: {Plan; Disposition:26390} Diet recommendation:  Discharge Diet Orders (From admission, onward)     Start     Ordered   05/20/24 0000  Diet Carb Modified        05/20/24 1222           {Diet_Plan:26776} DISCHARGE MEDICATION: Allergies as of 05/20/2024       Reactions   Codeine Hives         Medication List     PAUSE taking these medications    lisinopril  5 MG tablet Wait to take this until your doctor or other care provider tells you to start again. Commonly known as: ZESTRIL  Take 1 tablet (5 mg total) by mouth daily.       STOP taking these medications    liraglutide  18 MG/3ML Sopn Commonly known as: VICTOZA    Tresiba  FlexTouch 100 UNIT/ML FlexTouch Pen Generic drug: insulin  degludec       TAKE these medications    acetaminophen  325 MG tablet Commonly known as: TYLENOL  Take 2 tablets (650 mg total) by mouth every 6 (six) hours as needed for mild pain (pain score 1-3), moderate pain (pain score 4-6), fever or headache.   BLOOD GLUCOSE TEST STRIPS Strp 1 each by In Vitro route in the morning and at bedtime. May substitute to any manufacturer covered by patient's insurance.   ferrous sulfate  325 (65 FE) MG tablet Take 1 tablet (325 mg total) by mouth daily with breakfast.   gemfibrozil  600 MG tablet Commonly known as: LOPID  Take 1 tablet (600 mg total) by mouth 2 (two) times daily before a meal.   Insulin  Lispro Prot & Lispro (75-25) 100 UNIT/ML Kwikpen Commonly known as: HumaLOG  Mix 75/25 KwikPen Inject 20 Units into the skin 2 (two) times daily.   Insulin  Pen Needle 32G X 4 MM Misc 1 each by Does not apply route in the morning and at bedtime. What changed:  medication strength how much to take   Lancet Device Misc Use to test  blood glucose while fasting in the morning and 2 hours largest meal of the day   metFORMIN  500 MG 24 hr tablet Commonly known as: GLUCOPHAGE -XR Take 1 tablet (500 mg total) by mouth 2 (two) times daily with a meal. What changed:  medication strength how much to take when to take this   ONE TOUCH ULTRA 2 w/Device Kit USE TO TEST BLOOD SUGAR IN THE MORNING, AT NOON, AND AT BEDTIME        Follow-up Information     Simmons-Robinson, Makiera, MD. Schedule an appointment as soon as possible for a visit in 1 week(s).    Specialty: Family Medicine Contact information: [address] [place] [address] [phone]         Mona Vinie BROCKS, MD. Schedule an appointment as soon as possible for a visit in 2 week(s).   Specialty: Cardiology Contact information: [address] [place] [address] [phone]         Antonetta Okey JINNY Mickey., MD. Schedule an appointment as soon as possible for a visit in 1 week(s).   Specialty: Cardiology Contact information: [address] [place] [address] [place] [address] [phone]                Discharge Exam: Fredricka Weights   05/16/24 1705 05/19/24 0500 05/20/24 0500  Weight: 99.5 kg 97.5 kg 97.4 kg   ***  Condition at discharge: {DC Condition:26389}  The results of significant diagnostics from this hospitalization (including imaging, microbiology, ancillary and laboratory) are listed below for reference.   Imaging Studies: CT ABDOMEN PELVIS W CONTRAST Result Date: 05/19/2024 CLINICAL DATA:  Abdominal pain. EXAM: CT ABDOMEN AND PELVIS WITH CONTRAST TECHNIQUE: Multidetector CT imaging of the abdomen and pelvis was performed using the standard protocol following bolus administration of intravenous contrast. RADIATION DOSE REDUCTION: This exam was performed according to the departmental dose-optimization program which includes automated exposure control, adjustment of the mA and/or kV according to patient size and/or use of iterative reconstruction technique. CONTRAST:  OMNIPAQUE  IOHEXOL  300 MG/ML  SOLN COMPARISON:  CT abdomen pelvis dated 05/16/2024. FINDINGS: Lower chest: Small left pleural effusion, new since the prior CT. There is subsegmental compressive atelectasis of the left lung base. No intra-abdominal free air. Inflammatory fluid extending from the distal pancreas inferiorly along the left paracolic gutter. Hepatobiliary: Fatty liver. No biliary dilatation. The gallbladder is unremarkable. Pancreas: For  inflammatory changes of the pancreas predominantly involving the distal body and tail consistent with acute pancreatitis. Overall progression of inflammatory process compared to prior CT. No abscess or pseudocyst. Spleen: Mild splenomegaly measuring 14 cm. Adrenals/Urinary Tract: The adrenal glands are unremarkable. There is no hydronephrosis on either side. The visualized ureters and urinary bladder appear unremarkable. Stomach/Bowel: There is no obstruction or active inflammation. The appendix is normal. Vascular/Lymphatic: The abdominal aorta and IVC are unremarkable. No portal venous gas. There is no adenopathy. Reproductive: The prostate is grossly unremarkable. Other: None Musculoskeletal: No acute or significant osseous findings. IMPRESSION: 1. Acute pancreatitis. No abscess or pseudocyst. 2. Fatty liver. 3. Mild splenomegaly. 4. Small left pleural effusion. Electronically Signed   By: Vanetta Chou M.D.   On: 05/19/2024 18:11   ECHOCARDIOGRAM COMPLETE Result Date: 05/17/2024    ECHOCARDIOGRAM REPORT   Patient Name:   Yandiel Bergum Parker Date of Exam: 05/17/2024 Medical Rec #:  [id number]         Height:       70.0 in Accession #:    [id number]  Weight:       219.4 lb Date of Birth:  04-09-2001          BSA:          2.171 m Patient Age:    23 years          BP:           116/73 mmHg Patient Gender: M                 HR:           113 bpm. Exam Location:  [facility] Procedure: 2D Echo, Cardiac Doppler and Color Doppler (Both Spectral and Color            Flow Doppler were utilized during procedure). Indications:     Chest Pain R07.9  History:         Patient has no prior history of Echocardiogram examinations.                  Signs/Symptoms:Chest Pain.  Sonographer:     Ashley McNeely-Sloane Referring Phys:  [id number] [facility] [place] PAUDEL Diagnosing Phys: Lonni Hanson MD IMPRESSIONS  1. Left ventricular ejection fraction, by estimation, is 55 to 60%. The left ventricle has normal function. The left ventricle has  no regional wall motion abnormalities. Indeterminate diastolic filling due to E-A fusion.  2. Right ventricular systolic function is normal. The right ventricular size is normal. Tricuspid regurgitation signal is inadequate for assessing PA pressure.  3. The mitral valve is normal in structure. Trivial mitral valve regurgitation. No evidence of mitral stenosis.  4. The aortic valve is tricuspid. Aortic valve regurgitation is not visualized. No aortic stenosis is present. FINDINGS  Left Ventricle: Left ventricular ejection fraction, by estimation, is 55 to 60%. The left ventricle has normal function. The left ventricle has no regional wall motion abnormalities. The left ventricular internal cavity size was normal in size. There is  no left ventricular hypertrophy. Indeterminate diastolic filling due to E-A fusion. Right Ventricle: The right ventricular size is normal. No increase in right ventricular wall thickness. Right ventricular systolic function is normal. Tricuspid regurgitation signal is inadequate for assessing PA pressure. Left Atrium: Left atrial size was normal in size. Right Atrium: Right atrial size was normal in size. Pericardium: The pericardium was not well visualized. Mitral Valve: The mitral valve is normal in structure. Trivial mitral valve regurgitation. No evidence of mitral valve stenosis. MV peak gradient, 7.1 mmHg. The mean mitral valve gradient is 3.0 mmHg. Tricuspid Valve: The tricuspid valve is grossly normal. Tricuspid valve regurgitation is not demonstrated. Aortic Valve: The aortic valve is tricuspid. Aortic valve regurgitation is not visualized. No aortic stenosis is present. Aortic valve mean gradient measures 4.0 mmHg. Aortic valve peak gradient measures 8.9 mmHg. Aortic valve area, by VTI measures 3.05 cm. Pulmonic Valve: The pulmonic valve was normal in structure. Pulmonic valve regurgitation is not visualized. No evidence of pulmonic stenosis. Aorta: The aortic root and ascending  aorta are structurally normal, with no evidence of dilitation. Pulmonary Artery: The pulmonary artery is of normal size. IAS/Shunts: The interatrial septum was not well visualized.  LEFT VENTRICLE PLAX 2D LVIDd:         4.60 cm      Diastology LVIDs:         1.60 cm      LV e' medial:    16.20 cm/s LV PW:         1.00 cm      LV E/e'  medial:  6.9 LV IVS:        1.00 cm      LV e' lateral:   13.50 cm/s LVOT diam:     2.30 cm      LV E/e' lateral: 8.3 LV SV:         70 LV SV Index:   32 LVOT Area:     4.15 cm  LV Volumes (MOD) LV vol d, MOD A2C: 95.8 ml LV vol d, MOD A4C: 108.0 ml LV vol s, MOD A2C: 45.0 ml LV vol s, MOD A4C: 61.5 ml LV SV MOD A2C:     50.8 ml LV SV MOD A4C:     108.0 ml LV SV MOD BP:      48.3 ml RIGHT VENTRICLE RV Basal diam:  4.10 cm RV Mid diam:    3.10 cm RV S prime:     16.20 cm/s TAPSE (M-mode): 2.6 cm LEFT ATRIUM             Index        RIGHT ATRIUM           Index LA diam:        3.70 cm 1.70 cm/m   RA Area:     16.50 cm LA Vol (A2C):   43.5 ml 20.04 ml/m  RA Volume:   46.90 ml  21.60 ml/m LA Vol (A4C):   33.7 ml 15.52 ml/m LA Biplane Vol: 39.1 ml 18.01 ml/m  AORTIC VALVE                    PULMONIC VALVE AV Area (Vmax):    3.10 cm     PV Vmax:        1.08 m/s AV Area (Vmean):   3.30 cm     PV Vmean:       78.500 cm/s AV Area (VTI):     3.05 cm     PV VTI:         0.170 m AV Vmax:           149.00 cm/s  PV Peak grad:   4.7 mmHg AV Vmean:          96.500 cm/s  PV Mean grad:   3.0 mmHg AV VTI:            0.230 m      RVOT Peak grad: 2 mmHg AV Peak Grad:      8.9 mmHg AV Mean Grad:      4.0 mmHg LVOT Vmax:         111.00 cm/s LVOT Vmean:        76.700 cm/s LVOT VTI:          0.169 m LVOT/AV VTI ratio: 0.73  AORTA Ao Root diam: 3.10 cm Ao Asc diam:  3.00 cm MITRAL VALVE MV Area (PHT): 6.12 cm     SHUNTS MV Area VTI:   2.77 cm     Systemic VTI:  0.17 m MV Peak grad:  7.1 mmHg     Systemic Diam: 2.30 cm MV Mean grad:  3.0 mmHg     Pulmonic VTI:  0.110 m MV Vmax:       1.33 m/s MV  Vmean:      84.9 cm/s MV Decel Time: 124 msec MV E velocity: 112.00 cm/s Lonni Hanson MD Electronically signed by Lonni Hanson MD Signature Date/Time: 05/17/2024/1:19:56 PM    Final    CT ABDOMEN PELVIS W CONTRAST Result  Date: 05/16/2024 EXAM: CT ABDOMEN AND PELVIS WITH CONTRAST 12/17/2019 TECHNIQUE: CT of the abdomen and pelvis was performed with the administration of 100 mL of iohexol  (OMNIPAQUE ) 300 MG/ML solution. Multiplanar reformatted images are provided for review. Automated exposure control, iterative reconstruction, and/or weight-based adjustment of the mA/kV was utilized to reduce the radiation dose to as low as reasonably achievable. COMPARISON: Ultrasound abdomen 08/14/2022, CT abdomen and pelvis 12/28/2022 CLINICAL HISTORY: Abdominal pain, acute, nonlocalized; RLQ abdominal pain; R sided abd crampy pain nv after eating. FINDINGS: LOWER CHEST: No acute abnormality. LIVER: Diffusely hypodense hepatic parenchyma relative to the spleen. GALLBLADDER AND BILE DUCTS: Gallbladder is unremarkable. No biliary ductal dilatation. SPLEEN: Enlarged spleen measuring up to 14 cm. PANCREAS: Pancreatic fat stranding along the pancreatic tail and distal body. ADRENAL GLANDS: No acute abnormality. KIDNEYS, URETERS AND BLADDER: No stones in the kidneys or ureters. No hydronephrosis. No perinephric or periureteral stranding. Urinary bladder is unremarkable. GI AND BOWEL: Stomach demonstrates no acute abnormality. Colonic diverticulosis. No small or large bowel thickening or dilatation. The appendix is unremarkable. PERITONEUM AND RETROPERITONEUM: No ascites. No free air. VASCULATURE: Aorta is normal in caliber. LYMPH NODES: No lymphadenopathy. REPRODUCTIVE ORGANS: Normal prostate. BONES AND SOFT TISSUES: No acute osseous abnormality. No focal soft tissue abnormality. IMPRESSION: 1. Acute pancreatitis. 2. Splenomegaly. 3. Colonic diverticulosis without evidence of diverticulitis. Electronically signed by: Morgane  Naveau MD 05/16/2024 03:15 AM EST RP Workstation: [id number]    Microbiology: Results for orders placed or performed during the hospital encounter of 05/15/24  MRSA Next Gen by PCR, Nasal     Status: None   Collection Time: 05/16/24  6:39 PM   Specimen: Nasal Mucosa; Nasal Swab  Result Value Ref Range Status   MRSA by PCR Next Gen NOT DETECTED NOT DETECTED Final    Comment: (NOTE) The GeneXpert MRSA Assay (FDA approved for NASAL specimens only), is one component of a comprehensive MRSA colonization surveillance program. It is not intended to diagnose MRSA infection nor to guide or monitor treatment for MRSA infections. Test performance is not FDA approved in patients less than 56 years old. Performed at [facility], [address] [place], [place] [address]     Labs: CBC: Recent Labs  Lab 05/15/24 2125 05/16/24 0522 05/16/24 2131 05/17/24 0132 05/20/24 0629  WBC 9.8 9.2 9.3 11.2* 6.3  NEUTROABS  --  6.1 6.8 7.9*  --   HGB 14.3 13.6 13.8 12.3* 11.2*  HCT 38.8* 37.0* 39.4 34.6* 32.9*  MCV 77.4* 80.3 77.3* 75.7* 78.7*  PLT 363 281 215 208 238   Basic Metabolic Panel: Recent Labs  Lab 05/17/24 0132 05/17/24 0318 05/18/24 0309 05/19/24 0420 05/20/24 0629  NA 135 134* 135 134* 135  K 3.1* 3.5 3.6 3.6 3.8  CL 99 100 101 97* 99  CO2 26 23 24 25 26   GLUCOSE 100* 123* 99 171* 186*  BUN 7 7 <5* 14 17  CREATININE 0.72 0.63 0.69 0.76 0.74  CALCIUM 9.4 9.4 9.8 9.3 9.7  MG  --  2.0  --  2.0 2.2  PHOS  --  2.4*  --  4.1 4.0   Liver Function Tests: Recent Labs  Lab 05/16/24 0044 05/16/24 2131  AST 79* 18  ALT 50* 35  ALKPHOS 53 57  BILITOT 0.7 0.8  PROT 7.6 6.7  ALBUMIN 4.3 4.1   CBG: Recent Labs  Lab 05/19/24 1144 05/19/24 1706 05/19/24 2032 05/20/24 0727 05/20/24 1114  GLUCAP 179* 236* 271* 180* 148*  Discharge time spent: {LESS THAN/GREATER UYJW:73611} 30 minutes.  Signed: Alban Pepper, MD [facility] 05/22/2024

## 2024-05-23 ENCOUNTER — Telehealth: Payer: Self-pay | Admitting: *Deleted

## 2024-05-23 ENCOUNTER — Other Ambulatory Visit: Payer: Self-pay

## 2024-05-23 NOTE — Transitions of Care (Post Inpatient/ED Visit) (Signed)
° °  05/23/2024  Name: Aaron Parker MRN: [id number] DOB: 03-May-2001  Today's TOC FU Call Status: Today's TOC FU Call Status:: Unsuccessful Call (1st Attempt) Unsuccessful Call (1st Attempt) Date: 05/23/24  Attempted to reach the patient regarding the most recent Inpatient/ED visit.  Follow Up Plan: Additional outreach attempts will be made to reach the patient to complete the Transitions of Care (Post Inpatient/ED visit) call.   Cathlean Headland BSN RN [facility] Management Coordinator [email or web address] Direct Dial: [phone]  Fax: [phone] Website: [email or web address]

## 2024-05-24 ENCOUNTER — Telehealth: Payer: Self-pay

## 2024-05-24 LAB — ZNT8 ANTIBODIES: ZNT8 Antibodies: <15 U/mL

## 2024-05-24 NOTE — Transitions of Care (Post Inpatient/ED Visit) (Signed)
° °  05/24/2024  Name: Aaron Parker MRN: [id number] DOB: 2000/11/17  Today's TOC FU Call Status: Today's TOC FU Call Status:: Unsuccessful Call (2nd Attempt)  Attempted to reach the patient regarding the most recent Inpatient/ED visit. Left a HIPAA approved voicemail message to phone number provided in demographics per DPR.    Follow Up Plan: Additional outreach attempts will be made to reach the patient to complete the Transitions of Care (Post Inpatient/ED visit) call.   Richerd Fish, RN, BSN, CCM [facility], [facility] Management Coordinator Direct Dial: [phone]

## 2024-05-25 ENCOUNTER — Telehealth: Payer: Self-pay

## 2024-05-25 NOTE — Transitions of Care (Post Inpatient/ED Visit) (Signed)
° °  05/25/2024  Name: Aaron Parker MRN: [id number] DOB: 2000-08-14  Today's TOC FU Call Status: Today's TOC FU Call Status:: Unsuccessful Call (3rd Attempt) Unsuccessful Call (3rd Attempt) Date: 05/25/24  Attempted to reach the patient regarding the most recent Inpatient/ED visit. Left a HIPAA approved voicemail message to phone number provided in demographics per DPR.    Follow Up Plan: No further outreach attempts will be made at this time. We have been unable to contact the patient.  Richerd Fish, RN, BSN, CCM [facility], [facility] Management Coordinator Direct Dial: [phone]

## 2024-05-26 LAB — INSULIN ANTIBODIES, BLOOD: Insulin Antibodies, Human: <5 uU/mL

## 2024-05-27 LAB — IA-2 AUTOANTIBODIES: IA-2 Autoantibodies: <7.5 U/mL

## 2024-06-24 ENCOUNTER — Ambulatory Visit: Admitting: Family Medicine

## 2024-06-24 DIAGNOSIS — F333 Major depressive disorder, recurrent, severe with psychotic symptoms: Secondary | ICD-10-CM

## 2024-06-24 DIAGNOSIS — E1159 Type 2 diabetes mellitus with other circulatory complications: Secondary | ICD-10-CM

## 2024-06-24 DIAGNOSIS — Z23 Encounter for immunization: Secondary | ICD-10-CM

## 2024-06-24 DIAGNOSIS — Z1159 Encounter for screening for other viral diseases: Secondary | ICD-10-CM

## 2024-06-24 DIAGNOSIS — E1142 Type 2 diabetes mellitus with diabetic polyneuropathy: Secondary | ICD-10-CM

## 2024-06-24 DIAGNOSIS — K219 Gastro-esophageal reflux disease without esophagitis: Secondary | ICD-10-CM

## 2024-06-24 DIAGNOSIS — Z114 Encounter for screening for human immunodeficiency virus [HIV]: Secondary | ICD-10-CM
